# Patient Record
Sex: Male | Born: 1950 | Race: White | Hispanic: No | Marital: Married | Smoking: Never smoker
Health system: Southern US, Community
[De-identification: ages and names within clinical notes are randomized; demographics above are authoritative.]

## PROBLEM LIST (undated history)

## (undated) DIAGNOSIS — I10 Essential (primary) hypertension: Secondary | ICD-10-CM

## (undated) DIAGNOSIS — K219 Gastro-esophageal reflux disease without esophagitis: Secondary | ICD-10-CM

## (undated) DIAGNOSIS — K760 Fatty (change of) liver, not elsewhere classified: Secondary | ICD-10-CM

## (undated) DIAGNOSIS — E119 Type 2 diabetes mellitus without complications: Secondary | ICD-10-CM

## (undated) DIAGNOSIS — E785 Hyperlipidemia, unspecified: Secondary | ICD-10-CM

## (undated) HISTORY — PX: LYMPHADENECTOMY: SHX15

## (undated) HISTORY — DX: Essential (primary) hypertension: I10

## (undated) HISTORY — DX: Type 2 diabetes mellitus without complications: E11.9

## (undated) HISTORY — PX: UPPER GASTROINTESTINAL ENDOSCOPY: SHX188

## (undated) HISTORY — DX: Gastro-esophageal reflux disease without esophagitis: K21.9

## (undated) HISTORY — DX: Hyperlipidemia, unspecified: E78.5

## (undated) HISTORY — DX: Fatty (change of) liver, not elsewhere classified: K76.0

---

## 2008-05-24 ENCOUNTER — Encounter: Admission: RE | Admit: 2008-05-24 | Discharge: 2008-05-24 | Payer: Self-pay | Admitting: Family Medicine

## 2012-01-30 ENCOUNTER — Ambulatory Visit: Payer: Self-pay

## 2012-01-30 ENCOUNTER — Other Ambulatory Visit: Payer: Self-pay | Admitting: Occupational Medicine

## 2012-01-30 DIAGNOSIS — M25519 Pain in unspecified shoulder: Secondary | ICD-10-CM

## 2014-04-20 DIAGNOSIS — I10 Essential (primary) hypertension: Secondary | ICD-10-CM

## 2014-04-20 DIAGNOSIS — E7800 Pure hypercholesterolemia, unspecified: Secondary | ICD-10-CM

## 2014-04-20 DIAGNOSIS — K219 Gastro-esophageal reflux disease without esophagitis: Secondary | ICD-10-CM

## 2014-04-20 DIAGNOSIS — I739 Peripheral vascular disease, unspecified: Secondary | ICD-10-CM | POA: Insufficient documentation

## 2014-04-20 HISTORY — DX: Peripheral vascular disease, unspecified: I73.9

## 2014-04-20 HISTORY — DX: Essential (primary) hypertension: I10

## 2014-04-20 HISTORY — DX: Gastro-esophageal reflux disease without esophagitis: K21.9

## 2014-04-20 HISTORY — DX: Pure hypercholesterolemia, unspecified: E78.00

## 2015-01-29 HISTORY — PX: HERNIA REPAIR: SHX51

## 2015-10-10 DIAGNOSIS — M19049 Primary osteoarthritis, unspecified hand: Secondary | ICD-10-CM

## 2015-10-10 HISTORY — DX: Primary osteoarthritis, unspecified hand: M19.049

## 2016-03-13 DIAGNOSIS — E785 Hyperlipidemia, unspecified: Secondary | ICD-10-CM

## 2016-03-13 HISTORY — DX: Hyperlipidemia, unspecified: E78.5

## 2017-01-07 ENCOUNTER — Other Ambulatory Visit: Payer: Self-pay | Admitting: Orthopedic Surgery

## 2017-01-07 DIAGNOSIS — M5030 Other cervical disc degeneration, unspecified cervical region: Secondary | ICD-10-CM

## 2017-01-17 ENCOUNTER — Ambulatory Visit
Admission: RE | Admit: 2017-01-17 | Discharge: 2017-01-17 | Disposition: A | Discharge status: Home / Self Care | Payer: 59 | Source: Ambulatory Visit | Attending: Orthopedic Surgery | Admitting: Orthopedic Surgery

## 2017-01-17 ENCOUNTER — Other Ambulatory Visit: Payer: Self-pay

## 2017-01-17 DIAGNOSIS — M5030 Other cervical disc degeneration, unspecified cervical region: Secondary | ICD-10-CM

## 2017-07-04 DIAGNOSIS — K429 Umbilical hernia without obstruction or gangrene: Secondary | ICD-10-CM

## 2017-07-04 DIAGNOSIS — R011 Cardiac murmur, unspecified: Secondary | ICD-10-CM

## 2017-07-04 HISTORY — DX: Umbilical hernia without obstruction or gangrene: K42.9

## 2017-07-04 HISTORY — DX: Cardiac murmur, unspecified: R01.1

## 2017-11-25 ENCOUNTER — Ambulatory Visit (INDEPENDENT_AMBULATORY_CARE_PROVIDER_SITE_OTHER): Payer: 59 | Admitting: Cardiology

## 2017-11-25 ENCOUNTER — Encounter: Payer: Self-pay | Admitting: Cardiology

## 2017-11-25 VITALS — BP 124/70 | HR 90 | Ht 69.0 in | Wt 210.4 lb

## 2017-11-25 DIAGNOSIS — R9431 Abnormal electrocardiogram [ECG] [EKG]: Secondary | ICD-10-CM

## 2017-11-25 DIAGNOSIS — R0789 Other chest pain: Secondary | ICD-10-CM

## 2017-11-25 DIAGNOSIS — R7309 Other abnormal glucose: Secondary | ICD-10-CM | POA: Insufficient documentation

## 2017-11-25 HISTORY — DX: Other abnormal glucose: R73.09

## 2017-11-25 HISTORY — DX: Abnormal electrocardiogram (ECG) (EKG): R94.31

## 2017-11-25 HISTORY — DX: Other chest pain: R07.89

## 2017-11-25 NOTE — Progress Notes (Signed)
Cardiology Office Note:    Date:  11/25/2017   ID:  Jason Thornton, DOB 11/16/1950, MRN [id number]  PCP:  Manfred Shirts, PA  Cardiologist:  Jenne Campus, MD    Referring MD: Manfred Shirts, [place]   Chief Complaint  Patient presents with  . Follow-up  Doing well did have some cold-like symptoms 3 weeks ago  History of Present Illness:    Jason Thornton is a 67 y.o. male that I follow-up for many years he did not showed up in the office for about 1-1/2 years now about 3 weeks ago he started experiencing shortness of breath eventually was diagnosed with bronchitis that was treated with antibiotic he is doing better however want to be seen.  Described to have some exertional shortness of breath but overall seems to be doing well EKG today surprisingly showed possibility of anterior septal wall old MI.  Denies having any typical chest pain tightness squeezing pressure burning chest  Past Medical History:  Diagnosis Date  . Diabetes mellitus without complication ([place])   . Hypertension       Current Medications: Current Meds  Medication Sig  . aspirin EC 81 MG tablet Take 1 tablet by mouth daily.  Marland Kitchen lisinopril-hydrochlorothiazide (PRINZIDE,ZESTORETIC) 20-12.5 MG tablet Take 1 tablet by mouth daily.  . Omega-3 Fatty Acids (FISH OIL) 1000 MG CAPS Take 1 capsule by mouth daily.  . simvastatin (ZOCOR) 40 MG tablet Take 40 mg by mouth daily.     Allergies:   Niacin   Social History   Socioeconomic History  . Marital status: Married    Spouse name: Not on file  . Number of children: Not on file  . Years of education: Not on file  . Highest education level: Not on file  Occupational History  . Not on file  Social Needs  . Financial resource strain: Not on file  . Food insecurity:    Worry: Not on file    Inability: Not on file  . Transportation needs:    Medical: Not on file    Non-medical: Not on file  Tobacco Use  . Smoking status: Never Smoker  . Smokeless tobacco:  Never Used  Substance and Sexual Activity  . Alcohol use: Not Currently  . Drug use: Never  . Sexual activity: Not on file  Lifestyle  . Physical activity:    Days per week: Not on file    Minutes per session: Not on file  . Stress: Not on file  Relationships  . Social connections:    Talks on phone: Not on file    Gets together: Not on file    Attends religious service: Not on file    Active member of club or organization: Not on file    Attends meetings of clubs or organizations: Not on file    Relationship status: Not on file  Other Topics Concern  . Not on file  Social History Narrative  . Not on file     Family History: The patient's family history includes Alzheimer's disease in his mother; Heart attack in his father. ROS:   Please see the history of present illness.    All 14 point review of systems negative except as described per history of present illness  EKGs/Labs/Other Studies Reviewed:      Recent Labs: No results found for requested labs within last 8760 hours.  Recent Lipid Panel No results found for: CHOL, TRIG, HDL, CHOLHDL, VLDL, LDLCALC, LDLDIRECT  Physical Exam:  VS:  BP 124/70   Pulse 90   Ht 5' 9"  (1.753 m)   Wt 210 lb 6.4 oz (95.4 kg)   SpO2 96%   BMI 31.07 kg/m     Wt Readings from Last 3 Encounters:  11/25/17 210 lb 6.4 oz (95.4 kg)     GEN:  Well nourished, well developed in no acute distress HEENT: Normal NECK: No JVD; No carotid bruits LYMPHATICS: No lymphadenopathy CARDIAC: RRR, no murmurs, no rubs, no gallops RESPIRATORY:  Clear to auscultation without rales, wheezing or rhonchi  ABDOMEN: Soft, non-tender, non-distended MUSCULOSKELETAL:  No edema; No deformity  SKIN: Warm and dry LOWER EXTREMITIES: no swelling NEUROLOGIC:  Alert and oriented x 3 PSYCHIATRIC:  Normal affect   ASSESSMENT:    1. Elevated hemoglobin A1c   2. Abnormal EKG   3. Atypical chest pain    PLAN:    In order of problems listed  above:  1. Elevated hemoglobin A1c.  We will check his hemoglobin A1c today. 2. Abnormal EKG echocardiogram will be done. 3. Atypical chest pain denies having any.   Overall he seems to be doing well echocardiogram will be done to verify the presence of potentially all until microinfarction.  Hemoglobin A1c will be checked.   Medication Adjustments/Labs and Tests Ordered: Current medicines are reviewed at length with the patient today.  Concerns regarding medicines are outlined above.  Orders Placed This Encounter  Procedures  . Hemoglobin A1c  . ECHOCARDIOGRAM COMPLETE   Medication changes: No orders of the defined types were placed in this encounter.   Signed, Park Liter, MD, [facility] 11/25/2017 5:03 PM    [place]

## 2017-11-25 NOTE — Patient Instructions (Signed)
Medication Instructions:  Your physician recommends that you continue on your current medications as directed. Please refer to the Current Medication list given to you today.  If you need a refill on your cardiac medications before your next appointment, please call your pharmacy.   Lab work: Your physician recommends that you return for lab work today: Hemoglobin A1C   If you have labs (blood work) drawn today and your tests are completely normal, you will receive your results only by: Marland Kitchen MyChart Message (if you have MyChart) OR . A paper copy in the mail If you have any lab test that is abnormal or we need to change your treatment, we will call you to review the results.  Testing/Procedures: Your physician has requested that you have an echocardiogram. Echocardiography is a painless test that uses sound waves to create images of your heart. It provides your doctor with information about the size and shape of your heart and how well your heart's chambers and valves are working. This procedure takes approximately one hour. There are no restrictions for this procedure.    Follow-Up: At [facility], you and your health needs are our priority.  As part of our continuing mission to provide you with exceptional heart care, we have created designated Provider Care Teams.  These Care Teams include your primary Cardiologist (physician) and Advanced Practice Providers (APPs -  Physician Assistants and Nurse Practitioners) who all work together to provide you with the care you need, when you need it. You will need a follow up appointment in 5 months.  Please call our office 2 months in advance to schedule this appointment.  You may see No primary care provider on file. or another member of our [employer] Provider Team in [place]: Shirlee More, MD . Jyl Heinz, MD  Any Other Special Instructions Will Be Listed Below (If Applicable).  Echocardiogram An echocardiogram, or echocardiography,  uses sound waves (ultrasound) to produce an image of your heart. The echocardiogram is simple, painless, obtained within a short period of time, and offers valuable information to your health care provider. The images from an echocardiogram can provide information such as:  Evidence of coronary artery disease (CAD).  Heart size.  Heart muscle function.  Heart valve function.  Aneurysm detection.  Evidence of a past heart attack.  Fluid buildup around the heart.  Heart muscle thickening.  Assess heart valve function.  Tell a health care provider about:  Any allergies you have.  All medicines you are taking, including vitamins, herbs, eye drops, creams, and over-the-counter medicines.  Any problems you or family members have had with anesthetic medicines.  Any blood disorders you have.  Any surgeries you have had.  Any medical conditions you have.  Whether you are pregnant or may be pregnant. What happens before the procedure? No special preparation is needed. Eat and drink normally. What happens during the procedure?  In order to produce an image of your heart, gel will be applied to your chest and a wand-like tool (transducer) will be moved over your chest. The gel will help transmit the sound waves from the transducer. The sound waves will harmlessly bounce off your heart to allow the heart images to be captured in real-time motion. These images will then be recorded.  You may need an IV to receive a medicine that improves the quality of the pictures. What happens after the procedure? You may return to your normal schedule including diet, activities, and medicines, unless your health care  provider tells you otherwise. This information is not intended to replace advice given to you by your health care provider. Make sure you discuss any questions you have with your health care provider. Document Released: 01/12/2000 Document Revised: 09/02/2015 Document Reviewed:  09/21/2012 Elsevier Interactive Patient Education  2017 [employer].

## 2017-11-26 LAB — HEMOGLOBIN A1C
ESTIMATED AVERAGE GLUCOSE: 166 mg/dL
Hgb A1c MFr Bld: 7.4 % — ABNORMAL HIGH (ref 4.8–5.6)

## 2018-01-08 ENCOUNTER — Ambulatory Visit (INDEPENDENT_AMBULATORY_CARE_PROVIDER_SITE_OTHER): Payer: 59

## 2018-01-08 DIAGNOSIS — R9431 Abnormal electrocardiogram [ECG] [EKG]: Secondary | ICD-10-CM

## 2018-01-08 NOTE — Progress Notes (Signed)
Complete echocardiogram has been performed.  Jimmy Kipton Skillen RDCS, RVT 

## 2018-02-02 ENCOUNTER — Telehealth: Payer: Self-pay | Admitting: Cardiology

## 2018-02-02 NOTE — Telephone Encounter (Signed)
Wants a copy of labs for [employer] on letterhead for him to pick up

## 2018-02-02 NOTE — Telephone Encounter (Signed)
Called patient about this. He reports he already picked up from front desk He doesn't need anything else at this time.

## 2018-11-11 ENCOUNTER — Telehealth: Payer: Self-pay | Admitting: Gastroenterology

## 2018-11-11 DIAGNOSIS — R197 Diarrhea, unspecified: Secondary | ICD-10-CM

## 2018-11-11 DIAGNOSIS — R1032 Left lower quadrant pain: Secondary | ICD-10-CM

## 2018-11-11 DIAGNOSIS — K50919 Crohn's disease, unspecified, with unspecified complications: Secondary | ICD-10-CM

## 2018-11-11 DIAGNOSIS — D509 Iron deficiency anemia, unspecified: Secondary | ICD-10-CM

## 2018-11-11 NOTE — Telephone Encounter (Signed)
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  Pt's wife reported that pt is experiencing severe abd p and IBS symptoms: constipation.  Pt was offered appt for 12/08/18 but  requested a sooner appt and there is an opening today at 10:20 AM.  Please advise.

## 2018-11-12 ENCOUNTER — Encounter: Payer: Self-pay | Admitting: Gastroenterology

## 2018-11-12 NOTE — Telephone Encounter (Signed)
Patient returned call to the office-spoke with patient-patient informed of result note and MD recommendations; patient is agreeable with plan of care and verified PCP; result note routed to PCP per MD recommendations; Patient verbalized understanding of information/instructions;  Patient was advised to call the office at [phone] if questions/concerns arise;  Lab work orders have been placed in [place]; patient aware of labs needing to be completed at [facility] office prior to CT scan;  order for CT has been placed in Epic; RN to notify [place] CT for scheduling;

## 2018-11-12 NOTE — Telephone Encounter (Signed)
Left message for patient to call back to the office;  

## 2018-11-12 NOTE — Telephone Encounter (Signed)
Called and spoke with patient's wife-DPR verified-Brenda-Brenda reports the patient has been complaining of abd pain (LLQ) that he is trying to use a heating pad for minimal relief, using stool softeners (helping minimally), eating a high fiber diet; [name] reports the patient was dealing with bouts of constipation and the stomach pains were briefly relieved when the patient has a bowel movement;  Patient has been scheduled for an OV on 11/19/2018 at 3:40 pm-Brenda is aware of appt and will inform the patient of this appt;  Please advise of any measures that can be taken in the meantime prior to OV.

## 2018-11-12 NOTE — Telephone Encounter (Signed)
Not so sure what is going on with the patient He is not a complainer  Plan: -Lets get CBC, CMP, lipase and TSH -Proceed with CT Abdo/pelvis with p.o. and IV contrast -Follow-up as already scheduled with me Thx RG

## 2018-11-13 ENCOUNTER — Other Ambulatory Visit (INDEPENDENT_AMBULATORY_CARE_PROVIDER_SITE_OTHER): Payer: 59

## 2018-11-13 DIAGNOSIS — R1032 Left lower quadrant pain: Secondary | ICD-10-CM

## 2018-11-13 DIAGNOSIS — R197 Diarrhea, unspecified: Secondary | ICD-10-CM | POA: Diagnosis not present

## 2018-11-13 DIAGNOSIS — D509 Iron deficiency anemia, unspecified: Secondary | ICD-10-CM

## 2018-11-13 DIAGNOSIS — K50919 Crohn's disease, unspecified, with unspecified complications: Secondary | ICD-10-CM | POA: Diagnosis not present

## 2018-11-13 LAB — CBC WITH DIFFERENTIAL/PLATELET
Basophils Absolute: 0 10*3/uL (ref 0.0–0.1)
Basophils Relative: 0.5 % (ref 0.0–3.0)
Eosinophils Absolute: 0.1 10*3/uL (ref 0.0–0.7)
Eosinophils Relative: 1.4 % (ref 0.0–5.0)
HCT: 44.3 % (ref 39.0–52.0)
Hemoglobin: 15 g/dL (ref 13.0–17.0)
Lymphocytes Relative: 22.2 % (ref 12.0–46.0)
Lymphs Abs: 1.5 10*3/uL (ref 0.7–4.0)
MCHC: 33.8 g/dL (ref 30.0–36.0)
MCV: 88.9 fl (ref 78.0–100.0)
Monocytes Absolute: 0.7 10*3/uL (ref 0.1–1.0)
Monocytes Relative: 10.6 % (ref 3.0–12.0)
Neutro Abs: 4.4 10*3/uL (ref 1.4–7.7)
Neutrophils Relative %: 65.3 % (ref 43.0–77.0)
Platelets: 181 10*3/uL (ref 150.0–400.0)
RBC: 4.99 Mil/uL (ref 4.22–5.81)
RDW: 14.5 % (ref 11.5–15.5)
WBC: 6.8 10*3/uL (ref 4.0–10.5)

## 2018-11-13 LAB — COMPREHENSIVE METABOLIC PANEL
ALT: 21 U/L (ref 0–53)
AST: 16 U/L (ref 0–37)
Albumin: 4.4 g/dL (ref 3.5–5.2)
Alkaline Phosphatase: 59 U/L (ref 39–117)
BUN: 18 mg/dL (ref 6–23)
CO2: 26 mEq/L (ref 19–32)
Calcium: 9.5 mg/dL (ref 8.4–10.5)
Chloride: 100 mEq/L (ref 96–112)
Creatinine, Ser: 1.17 mg/dL (ref 0.40–1.50)
GFR: 61.95 mL/min (ref >60.00)
Glucose, Bld: 139 mg/dL — ABNORMAL HIGH (ref 70–99)
Potassium: 3.9 mEq/L (ref 3.5–5.1)
Sodium: 137 mEq/L (ref 135–145)
Total Bilirubin: 1.2 mg/dL (ref 0.2–1.2)
Total Protein: 7.1 g/dL (ref 6.0–8.3)

## 2018-11-13 LAB — TSH: TSH: 1.35 u[IU]/mL (ref 0.35–4.50)

## 2018-11-13 LAB — LIPASE: Lipase: 46 U/L (ref 11.0–59.0)

## 2018-11-13 NOTE — Telephone Encounter (Signed)
caleld and spoke with Pam at [facility]-she is going to call the patient to schedule CT abd/pel with the patient and advise on instructions/contrast pick up;

## 2018-11-19 ENCOUNTER — Encounter: Payer: Self-pay | Admitting: Gastroenterology

## 2018-11-19 ENCOUNTER — Encounter

## 2018-11-19 ENCOUNTER — Ambulatory Visit (INDEPENDENT_AMBULATORY_CARE_PROVIDER_SITE_OTHER)
Admission: RE | Admit: 2018-11-19 | Discharge: 2018-11-19 | Disposition: A | Discharge status: Home / Self Care | Payer: 59 | Source: Ambulatory Visit | Attending: Gastroenterology | Admitting: Gastroenterology

## 2018-11-19 ENCOUNTER — Ambulatory Visit (INDEPENDENT_AMBULATORY_CARE_PROVIDER_SITE_OTHER): Payer: 59 | Admitting: Gastroenterology

## 2018-11-19 ENCOUNTER — Other Ambulatory Visit: Payer: Self-pay

## 2018-11-19 VITALS — BP 126/78 | HR 80 | Temp 97.9°F | Ht 69.0 in | Wt 211.0 lb

## 2018-11-19 DIAGNOSIS — R1032 Left lower quadrant pain: Secondary | ICD-10-CM

## 2018-11-19 DIAGNOSIS — K50919 Crohn's disease, unspecified, with unspecified complications: Secondary | ICD-10-CM

## 2018-11-19 DIAGNOSIS — D509 Iron deficiency anemia, unspecified: Secondary | ICD-10-CM

## 2018-11-19 DIAGNOSIS — R197 Diarrhea, unspecified: Secondary | ICD-10-CM

## 2018-11-19 MED ORDER — IOHEXOL 300 MG/ML  SOLN
100.0000 mL | Freq: Once | INTRAMUSCULAR | Status: AC | PRN
  Administered 2018-11-19: 100 mL via INTRAVENOUS

## 2018-11-19 NOTE — Patient Instructions (Signed)
If you are age 68 or older, your body mass index should be between 23-30. Your Body mass index is 31.16 kg/m. If this is out of the aforementioned range listed, please consider follow up with your Primary Care Provider.  If you are age 37 or younger, your body mass index should be between 19-25. Your Body mass index is 31.16 kg/m. If this is out of the aformentioned range listed, please consider follow up with your Primary Care Provider.   You will be due for a recall colonoscopy in 07/2021. We will send you a reminder in the mail when it gets closer to that time.  Thank you,  Dr. Jackquline Denmark

## 2018-11-19 NOTE — Progress Notes (Signed)
Chief Complaint: Abdominal pain  Referring Provider:  Manfred Shirts, PA      ASSESSMENT AND PLAN;   #1.  LLQ pain (resolved). Neg CT 10/2018 except for colonic diverticulosis without diverticulitis  #2.  GERD.  Diet controlled.  Plan: - Continue current diet. - Copy of the CT given to the patient. - FU if with any problems - Recall colon at age 68. - Please correct the medical record-there is NO history of Crohn's disease.   HPI:    Jason Thornton is a 68 y.o. male  Started having left lower quadrant abdominal pain with abdominal bloating Underwent CT scan Abdo/pelvis-negative except for sigmoid diverticulosis without diverticulitis.  Currently patient doing very well.  He is having 2 bowel movements per day on high-fiber diet.  Denies having any further abdominal pain.  No nausea, vomiting, heartburn, regurgitation, odynophagia or dysphagia.  No significant diarrhea or constipation.  There is no melena or hematochezia. No unintentional weight loss.  Pleased with the progress.  SH-married lives with spouse Jason Thornton  Past GI procedures: -Colonoscopy January 2013-mild sigmoid diverticulosis, small internal hemorrhoids.  Repeat in 10 years. -EGD 10/2012-small hiatal hernia, negative small bowel biopsies for celiac. Past Medical History:  Diagnosis Date  . Diabetes mellitus without complication ([place])   . Fatty liver   . GERD (gastroesophageal reflux disease)   . Hyperlipidemia   . Hypertension     Past Surgical History:  Procedure Laterality Date  . COLONOSCOPY  02/25/2011   Mild sigmoid diverticulosis. Small internal hemorrhoids. Otherwise normal colonoscopy  . ESOPHAGOGASTRODUODENOSCOPY  11/18/2012   Presbyesophagus. Hiatal hernia  . LYMPHADENECTOMY  10/2006    Family History  Problem Relation Age of Onset  . Alzheimer's disease Mother   . Breast cancer Mother   . Heart attack Father   . Prostate cancer Father   . Prostate cancer Brother     Social  History   Tobacco Use  . Smoking status: Never Smoker  . Smokeless tobacco: Never Used  Substance Use Topics  . Alcohol use: Not Currently  . Drug use: Never    Current Outpatient Medications  Medication Sig Dispense Refill  . aspirin EC 81 MG tablet Take 1 tablet by mouth daily.    Marland Kitchen glimepiride (AMARYL) 2 MG tablet Take 2 mg by mouth 2 (two) times daily.    Marland Kitchen lisinopril-hydrochlorothiazide (PRINZIDE,ZESTORETIC) 20-12.5 MG tablet Take 1 tablet by mouth daily.  3  . Omega-3 Fatty Acids (FISH OIL) 1000 MG CAPS Take 1 capsule by mouth daily.    . simvastatin (ZOCOR) 40 MG tablet Take 40 mg by mouth daily.  3   No current facility-administered medications for this visit.     Allergies  Allergen Reactions  . Niacin Hives and Rash    Review of Systems:  Constitutional: Denies fever, chills, diaphoresis, appetite change and fatigue.  HEENT: Denies photophobia, eye pain, redness, hearing loss, ear pain, congestion, sore throat, rhinorrhea, sneezing, mouth sores, neck pain, neck stiffness and tinnitus.   Respiratory: Denies SOB, DOE, cough, chest tightness,  and wheezing.   Cardiovascular: Denies chest pain, palpitations and leg swelling.  Genitourinary: Denies dysuria, urgency, frequency, hematuria, flank pain and difficulty urinating.  Musculoskeletal: Denies myalgias, back pain, joint swelling, arthralgias and gait problem.  Skin: No rash.  Neurological: Denies dizziness, seizures, syncope, weakness, light-headedness, numbness and headaches.  Hematological: Denies adenopathy. Easy bruising, personal or family bleeding history  Psychiatric/Behavioral: No anxiety or depression     Physical Exam:  BP 126/78   Pulse 80   Temp 97.9 F (36.6 C)   Ht 5\' 9"  (1.753 m)   Wt 211 lb (95.7 kg)   SpO2 96%   BMI 31.16 kg/m  Filed Weights   11/19/18 1542  Weight: 211 lb (95.7 kg)   Constitutional:  Well-developed, in no acute distress. Psychiatric: Normal mood and affect.  Behavior is normal. HEENT: Pupils normal.  Conjunctivae are normal. No scleral icterus. Neck supple.  Cardiovascular: Normal rate, regular rhythm. No edema Pulmonary/chest: Effort normal and breath sounds normal. No wheezing, rales or rhonchi. Abdominal: Soft, nondistended. Nontender. Bowel sounds active throughout. There are no masses palpable. No hepatomegaly. Rectal:  defered Neurological: Alert and oriented to person place and time. Skin: Skin is warm and dry. No rashes noted.  Data Reviewed: I have personally reviewed following labs and imaging studies  CBC: CBC Latest Ref Rng & Units 11/13/2018  WBC 4.0 - 10.5 K/uL 6.8  Hemoglobin 13.0 - 17.0 g/dL 15.0  Hematocrit 39.0 - 52.0 % 44.3  Platelets 150.0 - 400.0 K/uL 181.0    CMP: CMP Latest Ref Rng & Units 11/13/2018  Glucose 70 - 99 mg/dL 139(H)  BUN 6 - 23 mg/dL 18  Creatinine 0.40 - 1.50 mg/dL 1.17  Sodium 135 - 145 mEq/L 137  Potassium 3.5 - 5.1 mEq/L 3.9  Chloride 96 - 112 mEq/L 100  CO2 19 - 32 mEq/L 26  Calcium 8.4 - 10.5 mg/dL 9.5  Total Protein 6.0 - 8.3 g/dL 7.1  Total Bilirubin 0.2 - 1.2 mg/dL 1.2  Alkaline Phos 39 - 117 U/L 59  AST 0 - 37 U/L 16  ALT 0 - 53 U/L 21    GFR: Estimated Creatinine Clearance: 69 mL/min (by C-G formula based on SCr of 1.17 mg/dL). Liver Function Tests: Recent Labs  Lab 11/13/18 0807  AST 16  ALT 21  ALKPHOS 59  BILITOT 1.2  PROT 7.1  ALBUMIN 4.4   Recent Labs  Lab 11/13/18 0807  LIPASE 46.0      Radiology Studies: Ct Abdomen Pelvis W Contrast  Result Date: 11/19/2018 CLINICAL DATA:  Patient with abdominal bloating. Pain. Prior umbilical hernia repair. History of Crohn's. EXAM: CT ABDOMEN AND PELVIS WITH CONTRAST TECHNIQUE: Multidetector CT imaging of the abdomen and pelvis was performed using the standard protocol following bolus administration of intravenous contrast. CONTRAST:  121mL OMNIPAQUE IOHEXOL 300 MG/ML  SOLN COMPARISON:  None. FINDINGS: Lower chest:  Normal heart size. Dependent atelectasis within the bilateral lower lobes. No pleural effusion. Hepatobiliary: The liver is normal in size and contour. No focal hepatic lesions identified. Gallbladder is unremarkable. No intrahepatic or extrahepatic biliary ductal dilatation. Pancreas: Unremarkable Spleen: Unremarkable Adrenals/Urinary Tract: Normal adrenal glands. Kidneys enhance symmetrically with contrast. Punctate 2 mm stone superior pole left kidney and inferior pole left kidney. Urinary bladder is unremarkable. Stomach/Bowel: Oral contrast material to the level of the rectum. Descending and sigmoid colonic diverticulosis. No CT evidence for acute diverticulitis. Normal appendix. Normal morphology of the stomach. No evidence for bowel obstruction. No free fluid or free intraperitoneal air. Vascular/Lymphatic: Normal caliber abdominal aorta. Peripheral calcified atherosclerotic plaque. No retroperitoneal lymphadenopathy. Reproductive: Heterogeneous prostate. Other: Small bilateral fat containing inguinal hernias. Musculoskeletal: Lumbar spine degenerative changes. No aggressive or acute appearing osseous lesions. IMPRESSION: No acute process within the abdomen or pelvis. Sigmoid colonic diverticulosis without CT evidence for acute diverticulitis. Electronically Signed   By: Lovey Newcomer M.D.   On: 11/19/2018 09:35      Merrie Roof  Lyndel Safe, MD 11/19/2018, 3:55 PM  Cc: Manfred Shirts, [place]

## 2018-11-29 ENCOUNTER — Emergency Department (HOSPITAL_BASED_OUTPATIENT_CLINIC_OR_DEPARTMENT_OTHER)
Admission: EM | Admit: 2018-11-29 | Discharge: 2018-11-29 | Disposition: A | Discharge status: Home / Self Care | Payer: 59 | Attending: Emergency Medicine | Admitting: Emergency Medicine

## 2018-11-29 ENCOUNTER — Other Ambulatory Visit: Payer: Self-pay

## 2018-11-29 ENCOUNTER — Emergency Department (HOSPITAL_BASED_OUTPATIENT_CLINIC_OR_DEPARTMENT_OTHER): Payer: 59

## 2018-11-29 ENCOUNTER — Encounter (HOSPITAL_BASED_OUTPATIENT_CLINIC_OR_DEPARTMENT_OTHER): Payer: Self-pay | Admitting: *Deleted

## 2018-11-29 DIAGNOSIS — Z79899 Other long term (current) drug therapy: Secondary | ICD-10-CM | POA: Insufficient documentation

## 2018-11-29 DIAGNOSIS — M25532 Pain in left wrist: Secondary | ICD-10-CM | POA: Insufficient documentation

## 2018-11-29 DIAGNOSIS — E119 Type 2 diabetes mellitus without complications: Secondary | ICD-10-CM | POA: Diagnosis not present

## 2018-11-29 DIAGNOSIS — I10 Essential (primary) hypertension: Secondary | ICD-10-CM | POA: Insufficient documentation

## 2018-11-29 DIAGNOSIS — M109 Gout, unspecified: Secondary | ICD-10-CM

## 2018-11-29 DIAGNOSIS — Z7984 Long term (current) use of oral hypoglycemic drugs: Secondary | ICD-10-CM | POA: Diagnosis not present

## 2018-11-29 DIAGNOSIS — Z7982 Long term (current) use of aspirin: Secondary | ICD-10-CM | POA: Insufficient documentation

## 2018-11-29 MED ORDER — INDOMETHACIN 25 MG PO CAPS: 25.0000 mg | ORAL_CAPSULE | Freq: Three times a day (TID) | ORAL | 0 refills | Status: DC

## 2018-11-29 MED ORDER — INDOMETHACIN 25 MG PO CAPS
50.0000 mg | ORAL_CAPSULE | ORAL | Status: AC
  Administered 2018-11-29: 07:00:00 50 mg via ORAL
  Filled 2018-11-29: qty 2

## 2018-11-29 MED ORDER — PREDNISONE 20 MG PO TABS: ORAL_TABLET | ORAL | 0 refills | Status: DC

## 2018-11-29 MED ORDER — PREDNISONE 50 MG PO TABS
60.0000 mg | ORAL_TABLET | Freq: Once | ORAL | Status: AC
  Administered 2018-11-29: 60 mg via ORAL
  Filled 2018-11-29: qty 1

## 2018-11-29 MED ORDER — ACETAMINOPHEN 500 MG PO TABS
1000.0000 mg | ORAL_TABLET | Freq: Once | ORAL | Status: AC
  Administered 2018-11-29: 07:00:00 1000 mg via ORAL
  Filled 2018-11-29: qty 2

## 2018-11-29 NOTE — ED Provider Notes (Signed)
[facility] Provider Note   CSN: [id number] Arrival date & time: 11/29/18  [id number]     History   Chief Complaint Chief Complaint  Patient presents with  . left wrist pain    HPI Jason Thornton is a 68 y.o. male.     The history is provided by the patient.  Wrist Pain This is a new problem. The current episode started 1 to 2 hours ago. The problem occurs constantly. The problem has not changed since onset.Pertinent negatives include no chest pain, no abdominal pain, no headaches and no shortness of breath. Nothing aggravates the symptoms. Nothing relieves the symptoms. He has tried nothing for the symptoms. The treatment provided no relief.  Left wrist pain that woke him from sleep.  Patient denies trauma.  No wounds. No weakness or numbness. Patient does endorse a h/o gout.    Past Medical History:  Diagnosis Date  . Diabetes mellitus without complication ([place])   . Fatty liver   . GERD (gastroesophageal reflux disease)   . Hyperlipidemia   . Hypertension     Patient Active Problem List   Diagnosis Date Noted  . Abnormal EKG 11/25/2017  . Elevated hemoglobin A1c 11/25/2017  . Atypical chest pain 11/25/2017    Past Surgical History:  Procedure Laterality Date  . COLONOSCOPY  02/25/2011   Mild sigmoid diverticulosis. Small internal hemorrhoids. Otherwise normal colonoscopy  . ESOPHAGOGASTRODUODENOSCOPY  11/18/2012   Presbyesophagus. Hiatal hernia  . LYMPHADENECTOMY  10/2006        Home Medications    Prior to Admission medications   Medication Sig Start Date End Date Taking? Authorizing Provider  aspirin EC 81 MG tablet Take 1 tablet by mouth daily.    [provider]  glimepiride (AMARYL) 2 MG tablet Take 2 mg by mouth 2 (two) times daily. 09/12/18   [provider]  lisinopril-hydrochlorothiazide (PRINZIDE,ZESTORETIC) 20-12.5 MG tablet Take 1 tablet by mouth daily. 10/06/17   [provider]  Omega-3 Fatty  Acids (FISH OIL) 1000 MG CAPS Take 1 capsule by mouth daily.    [provider]  simvastatin (ZOCOR) 40 MG tablet Take 40 mg by mouth daily. 10/31/17   [provider]    Family History Family History  Problem Relation Age of Onset  . Alzheimer's disease Mother   . Breast cancer Mother   . Heart attack Father   . Prostate cancer Father   . Prostate cancer Brother     Social History Social History   Tobacco Use  . Smoking status: Never Smoker  . Smokeless tobacco: Never Used  Substance Use Topics  . Alcohol use: Not Currently  . Drug use: Never     Allergies   Niacin   Review of Systems Review of Systems  Constitutional: Negative for fever.  HENT: Negative for congestion.   Eyes: Negative for visual disturbance.  Respiratory: Negative for shortness of breath.   Cardiovascular: Negative for chest pain.  Gastrointestinal: Negative for abdominal pain.  Musculoskeletal: Positive for arthralgias.  Skin: Negative for wound.  Neurological: Negative for weakness, numbness and headaches.  Psychiatric/Behavioral: Negative for agitation.  All other systems reviewed and are negative.    Physical Exam Updated Vital Signs BP (!) 149/85 (BP Location: Right Arm)   Pulse 90   Temp 98.1 F (36.7 C)   Resp 20   Ht 5\' 9"  (1.753 m)   Wt 94.3 kg   SpO2 100%   BMI 30.72 kg/m   Physical Exam  Vitals signs and nursing note reviewed.  Constitutional:      Appearance: Normal appearance.  HENT:     Head: Normocephalic and atraumatic.     Nose: Nose normal.  Eyes:     Conjunctiva/sclera: Conjunctivae normal.     Pupils: Pupils are equal, round, and reactive to light.  Neck:     Musculoskeletal: Normal range of motion and neck supple.  Cardiovascular:     Rate and Rhythm: Normal rate and regular rhythm.     Pulses: Normal pulses.     Heart sounds: Normal heart sounds.  Pulmonary:     Effort: Pulmonary effort is normal.     Breath sounds: Normal breath  sounds.  Abdominal:     General: Abdomen is flat. Bowel sounds are normal.     Tenderness: There is no abdominal tenderness. There is no guarding.  Musculoskeletal: Normal range of motion.     Left elbow: Normal.     Left wrist: He exhibits normal range of motion, no bony tenderness, no effusion, no crepitus, no deformity and no laceration.     Left forearm: Normal.     Left hand: Normal. He exhibits normal capillary refill. Normal sensation noted. Normal strength noted.  Neurological:     Mental Status: He is alert.      ED Treatments / Results  Labs (all labs ordered are listed, but only abnormal results are displayed) Labs Reviewed - No data to display  EKG None  Radiology No results found.  Procedures Procedures (including critical care time)  Medications Ordered in ED Medications  indomethacin (INDOCIN) capsule 50 mg (has no administration in time range)  acetaminophen (TYLENOL) tablet 1,000 mg (has no administration in time range)     Initial Impression / Assessment and Plan / ED Course  Exam is consistent with gout.  Will start indomethacin and steroids.  Decrease purines in your diet.  Drink copious water.     Jason Thornton was evaluated in Emergency Department on 11/29/2018 for the symptoms described in the history of present illness. He was evaluated in the context of the global COVID-19 pandemic, which necessitated consideration that the patient might be at risk for infection with the SARS-CoV-2 virus that causes COVID-19. Institutional protocols and algorithms that pertain to the evaluation of patients at risk for COVID-19 are in a state of rapid change based on information released by regulatory bodies including the CDC and federal and state organizations. These policies and algorithms were followed during the patient's care in the ED.   Final Clinical Impressions(s) / ED Diagnoses   Return for weakness, numbness, changes in vision or speech, fevers >100.4  unrelieved by medication, shortness of breath, intractable vomiting, or diarrhea, abdominal pain, Inability to tolerate liquids or food, cough, altered mental status or any concerns. No signs of systemic illness or infection. The patient is nontoxic-appearing on exam and vital signs are within normal limits.   I have reviewed the triage vital signs and the nursing notes. Pertinent labs &imaging results that were available during my care of the patient were reviewed by me and considered in my medical decision making (see chart for details).  After history, exam, and medical workup I feel the patient has been appropriately medically screened and is safe for discharge home. Pertinent diagnoses were discussed with the patient. Patient was given return precautions   Montgomery Rothlisberger, MD 11/29/18 [phone]

## 2018-11-29 NOTE — ED Triage Notes (Signed)
C/o left wrist pain that started Sat morning. Denies any injury. Pt has a history of gout. Describes as a constant pain. Has taken tylenol for pain with little relief.

## 2018-12-08 ENCOUNTER — Ambulatory Visit: Payer: 59 | Admitting: Gastroenterology

## 2019-02-01 DIAGNOSIS — E1151 Type 2 diabetes mellitus with diabetic peripheral angiopathy without gangrene: Secondary | ICD-10-CM

## 2019-02-01 HISTORY — DX: Type 2 diabetes mellitus with diabetic peripheral angiopathy without gangrene: E11.51

## 2019-06-23 DIAGNOSIS — M79674 Pain in right toe(s): Secondary | ICD-10-CM

## 2019-06-23 HISTORY — DX: Pain in right toe(s): M79.674

## 2019-12-03 DIAGNOSIS — E119 Type 2 diabetes mellitus without complications: Secondary | ICD-10-CM | POA: Insufficient documentation

## 2019-12-03 DIAGNOSIS — K760 Fatty (change of) liver, not elsewhere classified: Secondary | ICD-10-CM | POA: Insufficient documentation

## 2019-12-03 DIAGNOSIS — E785 Hyperlipidemia, unspecified: Secondary | ICD-10-CM | POA: Insufficient documentation

## 2019-12-05 IMAGING — DX DG WRIST COMPLETE 3+V*L*
4 series · 4 of 4 positions shown · non-contrast
Comparison: None.

CLINICAL DATA: Left wrist pain since yesterday morning. No known
injury.

EXAM:
LEFT WRIST - COMPLETE 3+ VIEW

[wrist ap]
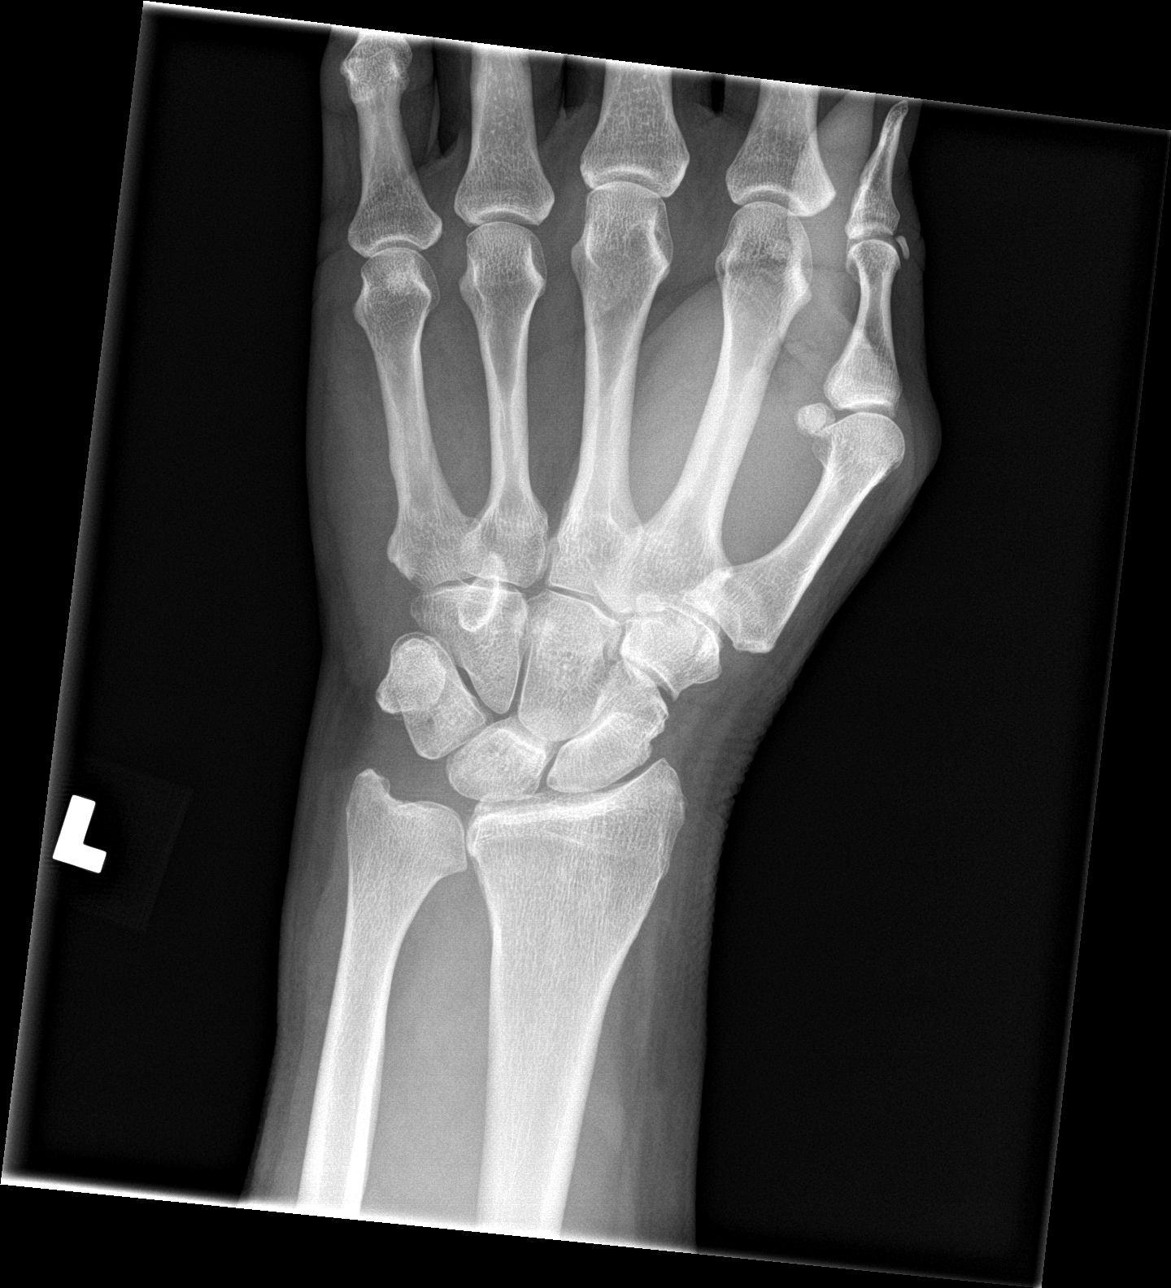

[wrist obl (1 of 2)]
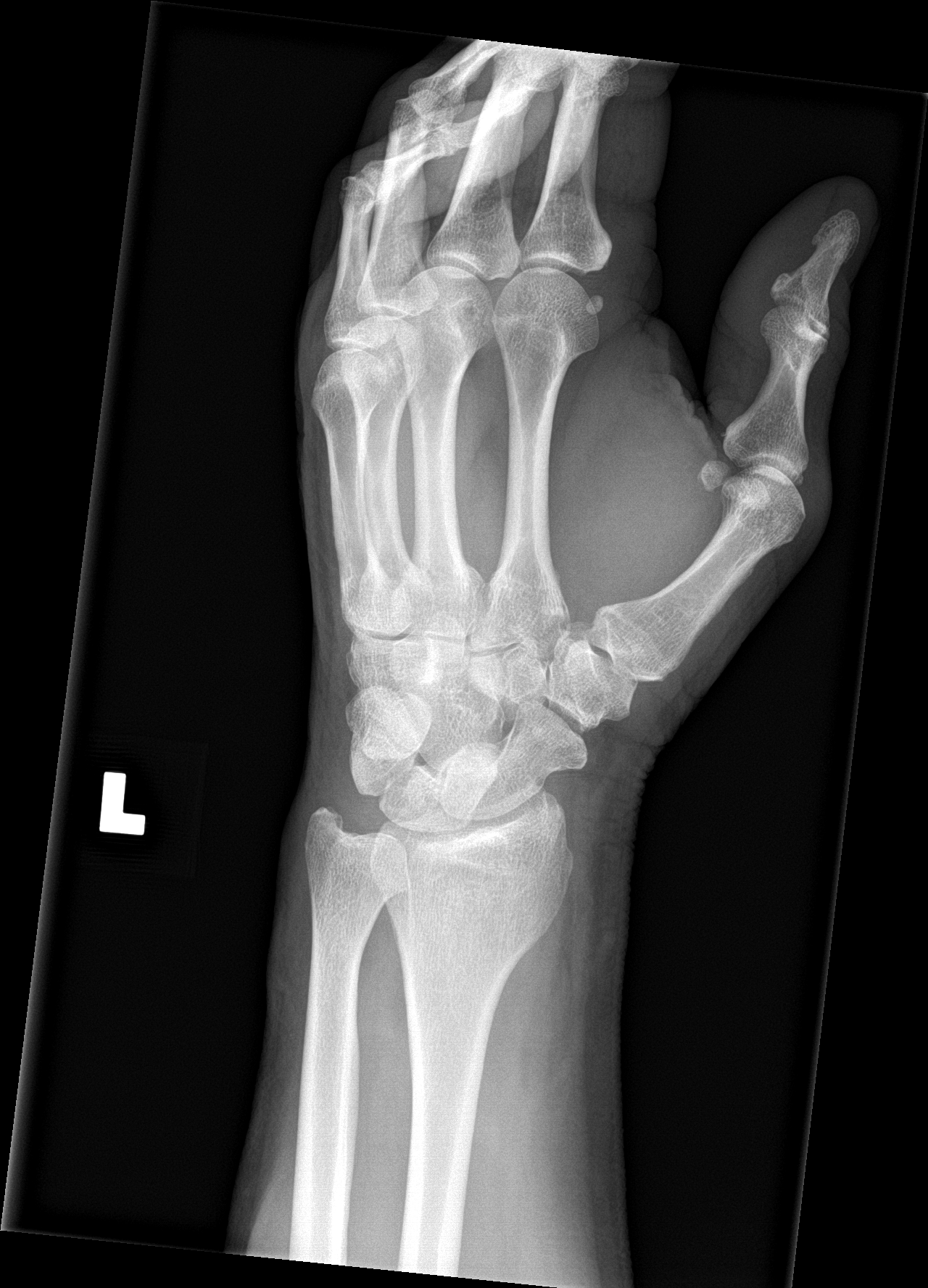

[wrist tunnel]
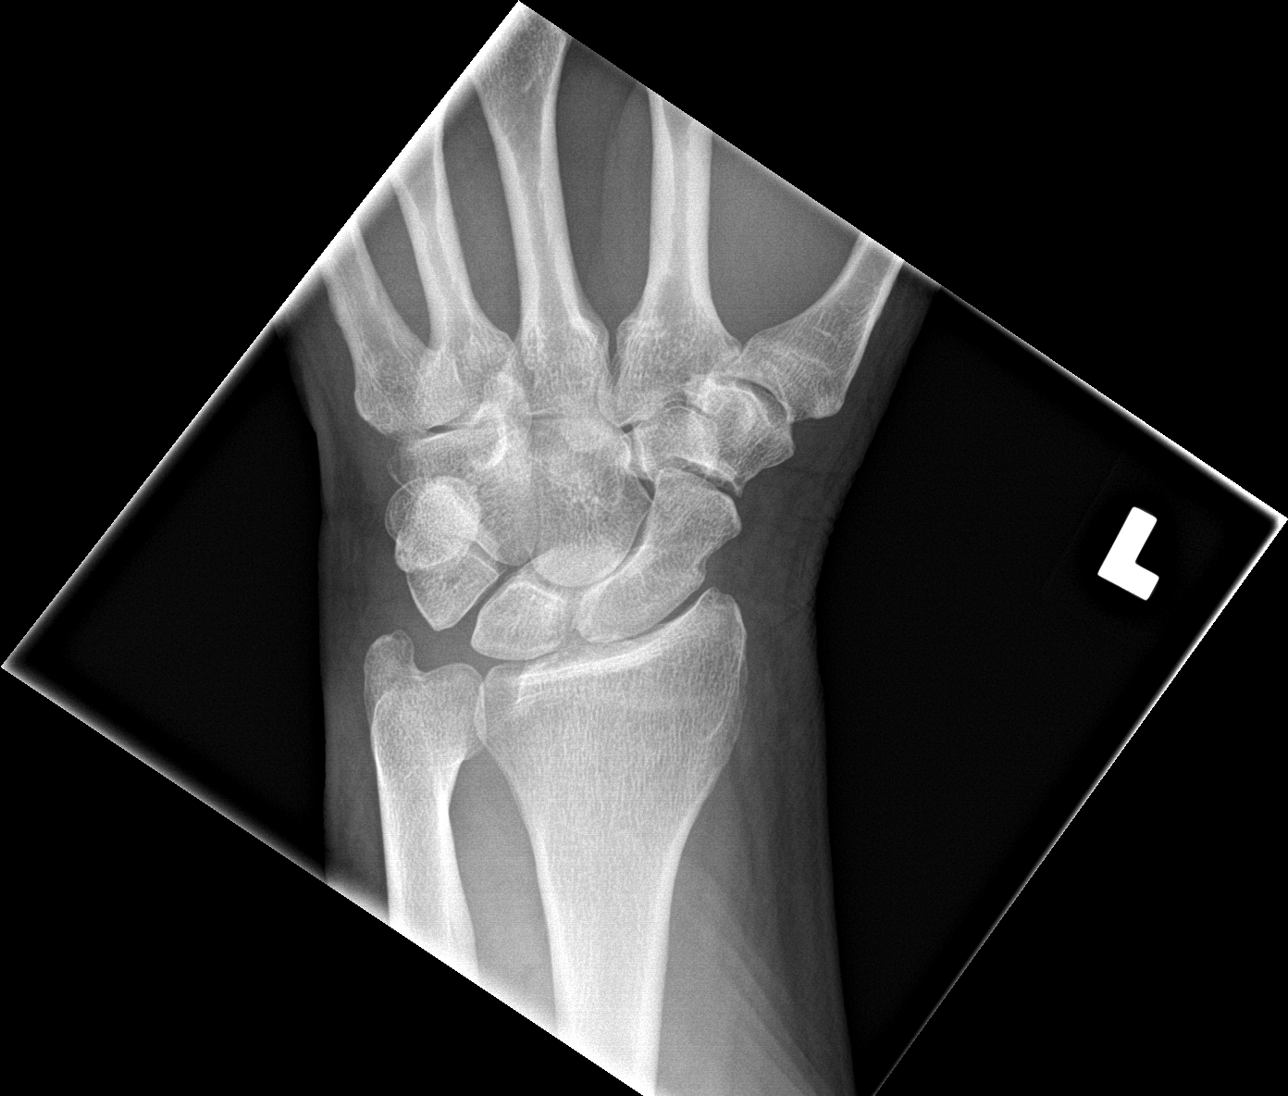

[wrist obl (2 of 2)]
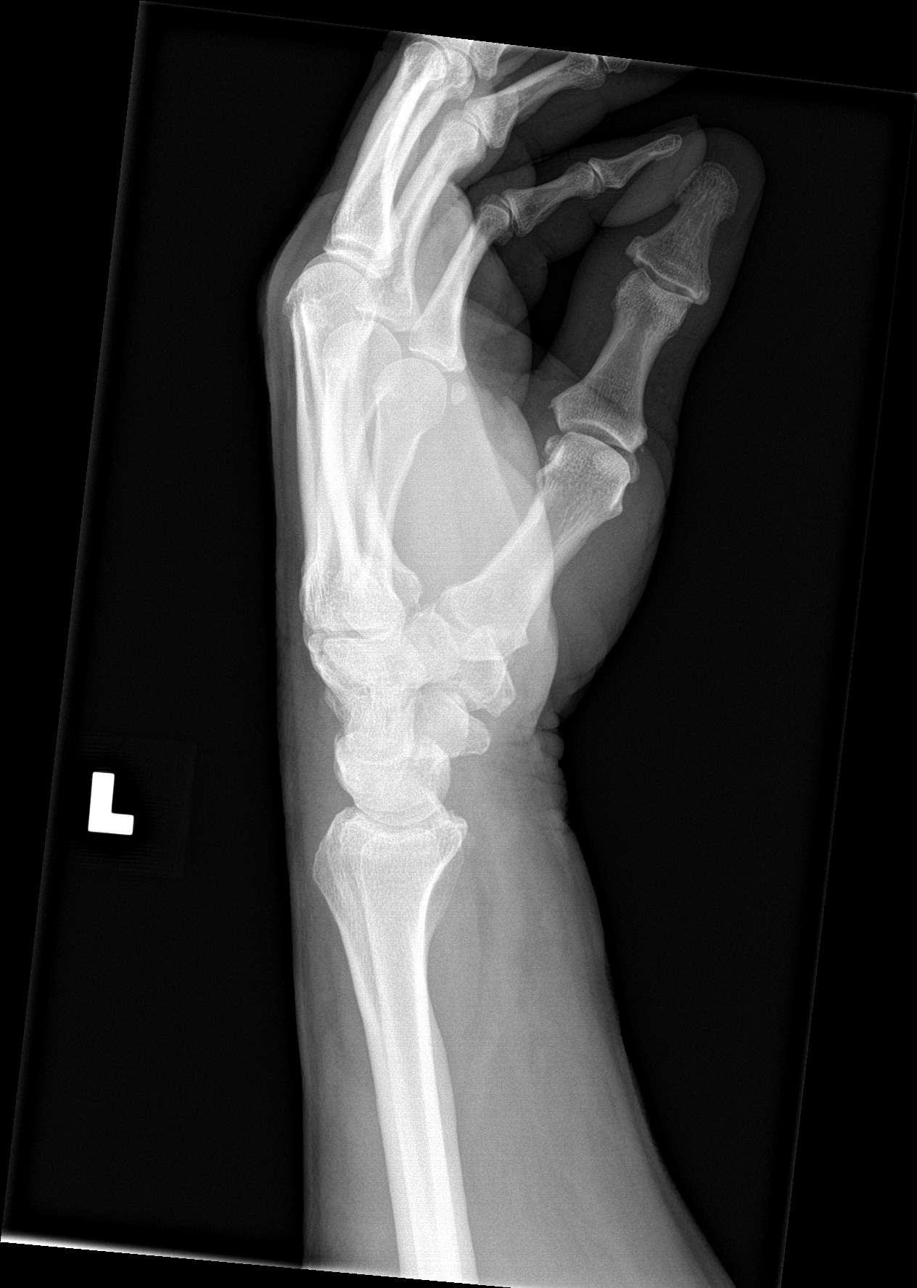

[4 of 4 positions shown; findings below may reference images not displayed]

FINDINGS: There is no evidence of fracture or dislocation. There is no
evidence of arthropathy or other focal bone abnormality. Soft
tissues are unremarkable.
IMPRESSION: Normal examination.

## 2019-12-06 ENCOUNTER — Other Ambulatory Visit: Payer: Self-pay

## 2019-12-06 ENCOUNTER — Ambulatory Visit (INDEPENDENT_AMBULATORY_CARE_PROVIDER_SITE_OTHER): Payer: 59 | Admitting: Cardiology

## 2019-12-06 ENCOUNTER — Encounter: Payer: Self-pay | Admitting: Cardiology

## 2019-12-06 VITALS — BP 124/94 | HR 74 | Ht 69.0 in | Wt 213.0 lb

## 2019-12-06 DIAGNOSIS — I739 Peripheral vascular disease, unspecified: Secondary | ICD-10-CM | POA: Diagnosis not present

## 2019-12-06 DIAGNOSIS — E1151 Type 2 diabetes mellitus with diabetic peripheral angiopathy without gangrene: Secondary | ICD-10-CM | POA: Diagnosis not present

## 2019-12-06 DIAGNOSIS — E119 Type 2 diabetes mellitus without complications: Secondary | ICD-10-CM

## 2019-12-06 DIAGNOSIS — E785 Hyperlipidemia, unspecified: Secondary | ICD-10-CM

## 2019-12-06 DIAGNOSIS — I10 Essential (primary) hypertension: Secondary | ICD-10-CM | POA: Diagnosis not present

## 2019-12-06 DIAGNOSIS — R0609 Other forms of dyspnea: Secondary | ICD-10-CM | POA: Insufficient documentation

## 2019-12-06 DIAGNOSIS — R0600 Dyspnea, unspecified: Secondary | ICD-10-CM

## 2019-12-06 DIAGNOSIS — R0789 Other chest pain: Secondary | ICD-10-CM

## 2019-12-06 HISTORY — DX: Other forms of dyspnea: R06.09

## 2019-12-06 HISTORY — DX: Dyspnea, unspecified: R06.00

## 2019-12-06 NOTE — Patient Instructions (Signed)
Medication Instructions:  Your physician recommends that you continue on your current medications as directed. Please refer to the Current Medication list given to you today.  *If you need a refill on your cardiac medications before your next appointment, please call your pharmacy*   Lab Work: Your physician recommends that you return for lab work today: lipid, cmp, tsh  If you have labs (blood work) drawn today and your tests are completely normal, you will receive your results only by: Marland Kitchen MyChart Message (if you have MyChart) OR . A paper copy in the mail If you have any lab test that is abnormal or we need to change your treatment, we will call you to review the results.   Testing/Procedures: Your physician has requested that you have an echocardiogram. Echocardiography is a painless test that uses sound waves to create images of your heart. It provides your doctor with information about the size and shape of your heart and how well your heart's chambers and valves are working. This procedure takes approximately one hour. There are no restrictions for this procedure.  Your physician has requested that you have a lower or upper extremity arterial duplex. This test is an ultrasound of the arteries in the legs or arms. It looks at arterial blood flow in the legs and arms. Allow one hour for Lower and Upper Arterial scans. There are no restrictions or special instructions    Follow-Up: At [facility], you and your health needs are our priority.  As part of our continuing mission to provide you with exceptional heart care, we have created designated Provider Care Teams.  These Care Teams include your primary Cardiologist (physician) and Advanced Practice Providers (APPs -  Physician Assistants and Nurse Practitioners) who all work together to provide you with the care you need, when you need it.  We recommend signing up for the patient portal called "MyChart".  Sign up information is provided  on this After Visit Summary.  MyChart is used to connect with patients for Virtual Visits (Telemedicine).  Patients are able to view lab/test results, encounter notes, upcoming appointments, etc.  Non-urgent messages can be sent to your provider as well.   To learn more about what you can do with MyChart, go to [email or web address].    Your next appointment:   3 month(s)  The format for your next appointment:   In Person  Provider:   Jenne Campus, MD   Other Instructions   Echocardiogram An echocardiogram is a procedure that uses painless sound waves (ultrasound) to produce an image of the heart. Images from an echocardiogram can provide important information about:  Signs of coronary artery disease (CAD).  Aneurysm detection. An aneurysm is a weak or damaged part of an artery wall that bulges out from the normal force of blood pumping through the body.  Heart size and shape. Changes in the size or shape of the heart can be associated with certain conditions, including heart failure, aneurysm, and CAD.  Heart muscle function.  Heart valve function.  Signs of a past heart attack.  Fluid buildup around the heart.  Thickening of the heart muscle.  A tumor or infectious growth around the heart valves. Tell a health care provider about:  Any allergies you have.  All medicines you are taking, including vitamins, herbs, eye drops, creams, and over-the-counter medicines.  Any blood disorders you have.  Any surgeries you have had.  Any medical conditions you have.  Whether you are pregnant or may be  pregnant. What are the risks? Generally, this is a safe procedure. However, problems may occur, including:  Allergic reaction to dye (contrast) that may be used during the procedure. What happens before the procedure? No specific preparation is needed. You may eat and drink normally. What happens during the procedure?   An IV tube may be inserted into one of your  veins.  You may receive contrast through this tube. A contrast is an injection that improves the quality of the pictures from your heart.  A gel will be applied to your chest.  A wand-like tool (transducer) will be moved over your chest. The gel will help to transmit the sound waves from the transducer.  The sound waves will harmlessly bounce off of your heart to allow the heart images to be captured in real-time motion. The images will be recorded on a computer. The procedure may vary among health care providers and hospitals. What happens after the procedure?  You may return to your normal, everyday life, including diet, activities, and medicines, unless your health care provider tells you not to do that. Summary  An echocardiogram is a procedure that uses painless sound waves (ultrasound) to produce an image of the heart.  Images from an echocardiogram can provide important information about the size and shape of your heart, heart muscle function, heart valve function, and fluid buildup around your heart.  You do not need to do anything to prepare before this procedure. You may eat and drink normally.  After the echocardiogram is completed, you may return to your normal, everyday life, unless your health care provider tells you not to do that. This information is not intended to replace advice given to you by your health care provider. Make sure you discuss any questions you have with your health care provider. Document Revised: 05/07/2018 Document Reviewed: 02/17/2016 Elsevier Patient Education  [place].

## 2019-12-06 NOTE — Addendum Note (Signed)
Addended by: Senaida Ores on: 12/06/2019 10:55 AM   Modules accepted: Orders

## 2019-12-06 NOTE — Progress Notes (Signed)
Cardiology Office Note:    Date:  12/06/2019   ID:  Jason Thornton, DOB 02-14-1950, MRN [id number]  PCP:  Manfred Shirts, PA  Cardiologist:  Jenne Campus, MD    Referring MD: Manfred Shirts, [place]   Chief Complaint  Patient presents with  . Annual Exam    History of Present Illness:    Jason Thornton is a 69 y.o. male past medical history significant for diabetes, essential hypertension, dyslipidemia.  Comes today to my office for follow-up I have not seen him since 2019.  He describes 2 issues #1 when he walks he will get fatigued tired and short of breath.  Also described to have some pain on the left side of his neck which is not related to exercise.  It was with turning his head.  Another complaint he have is the fact that he got pain in his leg sometimes when he walks but majority of time when he lay down at night.  He is diabetic with is not controlled, does have dyslipidemia.  Likely does not smoke.  He try to exercise on the regular basis but get tired very easily.  Denies have any chest pain tightness squeezing pressure burning chest or shortness of breath with exertion  Past Medical History:  Diagnosis Date  . Abnormal EKG 11/25/2017  . Arthritis pain, hand 10/10/2015  . Atypical chest pain 11/25/2017  . Diabetes mellitus without complication ([place])   . Elevated hemoglobin A1c 11/25/2017  . Essential hypertension 04/20/2014  . Fatty liver   . Gastroesophageal reflux disease without esophagitis 04/20/2014  . GERD (gastroesophageal reflux disease)   . Hyperlipidemia   . Hypertension   . Peripheral vascular disease ([place]) 04/20/2014   Formatting of this note might be different from the original. Non-obstructing carotids. Non-obstructing carotids.  Formatting of this note might be different from the original. Non-obstructing carotids. Formatting of this note might be different from the original. Overview:  Non-obstructing carotids.  . Type 2 diabetes mellitus with diabetic  peripheral angiopathy without gangrene, without long-term current use of insulin ([place]) 02/01/2019   Last Assessment & Plan:  Formatting of this note might be different from the original.    Past Surgical History:  Procedure Laterality Date  . COLONOSCOPY  02/25/2011   Mild sigmoid diverticulosis. Small internal hemorrhoids. Otherwise normal colonoscopy  . ESOPHAGOGASTRODUODENOSCOPY  11/18/2012   Presbyesophagus. Hiatal hernia  . LYMPHADENECTOMY  10/2006    Current Medications: Current Meds  Medication Sig  . aspirin EC 81 MG tablet Take 1 tablet by mouth daily.  Marland Kitchen glimepiride (AMARYL) 2 MG tablet Take 2 mg by mouth 2 (two) times daily.  Marland Kitchen lisinopril-hydrochlorothiazide (PRINZIDE,ZESTORETIC) 20-12.5 MG tablet Take 1 tablet by mouth daily.  . Omega-3 Fatty Acids (FISH OIL) 1000 MG CAPS Take 1 capsule by mouth daily.  . simvastatin (ZOCOR) 40 MG tablet Take 40 mg by mouth daily.     Allergies:   Niacin   Social History   Socioeconomic History  . Marital status: Married    Spouse name: Not on file  . Number of children: Not on file  . Years of education: Not on file  . Highest education level: Not on file  Occupational History  . Not on file  Tobacco Use  . Smoking status: Never Smoker  . Smokeless tobacco: Never Used  Vaping Use  . Vaping Use: Never used  Substance and Sexual Activity  . Alcohol use: Not Currently  . Drug use: Never  . Sexual  activity: Not on file  Other Topics Concern  . Not on file  Social History Narrative  . Not on file   Social Determinants of Health   Financial Resource Strain:   . Difficulty of Paying Living Expenses: Not on file  Food Insecurity:   . Worried About Charity fundraiser in the Last Year: Not on file  . Ran Out of Food in the Last Year: Not on file  Transportation Needs:   . Lack of Transportation (Medical): Not on file  . Lack of Transportation (Non-Medical): Not on file  Physical Activity:   . Days of Exercise per Week:  Not on file  . Minutes of Exercise per Session: Not on file  Stress:   . Feeling of Stress : Not on file  Social Connections:   . Frequency of Communication with Friends and Family: Not on file  . Frequency of Social Gatherings with Friends and Family: Not on file  . Attends Religious Services: Not on file  . Active Member of Clubs or Organizations: Not on file  . Attends Archivist Meetings: Not on file  . Marital Status: Not on file     Family History: The patient's family history includes Alzheimer's disease in his mother; Breast cancer in his mother; Heart attack in his father; Prostate cancer in his brother and father. ROS:   Please see the history of present illness.    All 14 point review of systems negative except as described per history of present illness  EKGs/Labs/Other Studies Reviewed:      Recent Labs: No results found for requested labs within last 8760 hours.  Recent Lipid Panel No results found for: CHOL, TRIG, HDL, CHOLHDL, VLDL, LDLCALC, LDLDIRECT  Physical Exam:    VS:  BP (!) 124/94 (BP Location: Left Arm, Patient Position: Sitting, Cuff Size: Normal)   Pulse 74   Ht 5' 9"  (1.753 m)   Wt 213 lb (96.6 kg)   SpO2 97%   BMI 31.45 kg/m     Wt Readings from Last 3 Encounters:  12/06/19 213 lb (96.6 kg)  11/29/18 208 lb (94.3 kg)  11/19/18 211 lb (95.7 kg)     GEN:  Well nourished, well developed in no acute distress HEENT: Normal NECK: No JVD; No carotid bruits LYMPHATICS: No lymphadenopathy CARDIAC: RRR, no murmurs, no rubs, no gallops RESPIRATORY:  Clear to auscultation without rales, wheezing or rhonchi  ABDOMEN: Soft, non-tender, non-distended MUSCULOSKELETAL:  No edema; No deformity  SKIN: Warm and dry LOWER EXTREMITIES: no swelling NEUROLOGIC:  Alert and oriented x 3 PSYCHIATRIC:  Normal affect   ASSESSMENT:    1. Peripheral vascular disease ([place])   2. Essential hypertension   3. Type 2 diabetes mellitus with diabetic  peripheral angiopathy without gangrene, without long-term current use of insulin ([place])   4. Diabetes mellitus without complication ([place])   5. Atypical chest pain   6. Dyslipidemia (high LDL; low HDL)   7. Dyspnea on exertion    PLAN:    In order of problems listed above:  1. Peripheral vascular disease.  He describes atypical symptoms of his lower extremities but when physical examination he does have poor pulse in the left side.  I will ask him to have arterial duplex of both lower extremities. 2. Essential hypertension blood pressure slightly elevated today but this is first visit after long time.  His diastolic appears to be particularly elevated.  He does have blood pressure monitor at home I advised  him to check on the regular basis let me know. 3. Diabetes he is thinking only Amaryl, I will check his hemoglobin A1c.  He is follow-up with primary care physician in December. 4. Atypical chest pain denies having any. 5. Dyslipidemia: We will do his fasting lipid profile. 6. Dyspnea on exertion: Echocardiogram will be done to check left ventricle ejection fraction   Medication Adjustments/Labs and Tests Ordered: Current medicines are reviewed at length with the patient today.  Concerns regarding medicines are outlined above.  No orders of the defined types were placed in this encounter.  Medication changes: No orders of the defined types were placed in this encounter.   Signed, Park Liter, MD, [facility] 12/06/2019 10:48 AM    [place]

## 2019-12-07 ENCOUNTER — Telehealth: Payer: Self-pay | Admitting: Cardiology

## 2019-12-07 ENCOUNTER — Telehealth: Payer: Self-pay | Admitting: Emergency Medicine

## 2019-12-07 DIAGNOSIS — E785 Hyperlipidemia, unspecified: Secondary | ICD-10-CM

## 2019-12-07 LAB — COMPREHENSIVE METABOLIC PANEL
ALT: 33 IU/L (ref 0–44)
AST: 24 IU/L (ref 0–40)
Albumin/Globulin Ratio: 2.3 — ABNORMAL HIGH (ref 1.2–2.2)
Albumin: 4.9 g/dL — ABNORMAL HIGH (ref 3.8–4.8)
Alkaline Phosphatase: 69 IU/L (ref 44–121)
BUN/Creatinine Ratio: 13 (ref 10–24)
BUN: 14 mg/dL (ref 8–27)
Bilirubin Total: 0.9 mg/dL (ref 0.0–1.2)
CO2: 26 mmol/L (ref 20–29)
Calcium: 10.3 mg/dL — ABNORMAL HIGH (ref 8.6–10.2)
Chloride: 100 mmol/L (ref 96–106)
Creatinine, Ser: 1.09 mg/dL (ref 0.76–1.27)
GFR calc Af Amer: 80 mL/min/{1.73_m2} (ref >59)
GFR calc non Af Amer: 69 mL/min/{1.73_m2} (ref >59)
Globulin, Total: 2.1 g/dL (ref 1.5–4.5)
Glucose: 125 mg/dL — ABNORMAL HIGH (ref 65–99)
Potassium: 4.2 mmol/L (ref 3.5–5.2)
Sodium: 139 mmol/L (ref 134–144)
Total Protein: 7 g/dL (ref 6.0–8.5)

## 2019-12-07 LAB — HEMOGLOBIN A1C
Est. average glucose Bld gHb Est-mCnc: 160 mg/dL
Hgb A1c MFr Bld: 7.2 % — ABNORMAL HIGH (ref 4.8–5.6)

## 2019-12-07 LAB — TSH: TSH: 1.59 u[IU]/mL (ref 0.450–4.500)

## 2019-12-07 LAB — LIPID PANEL
Chol/HDL Ratio: 4.8 ratio (ref 0.0–5.0)
Cholesterol, Total: 182 mg/dL (ref 100–199)
HDL: 38 mg/dL — ABNORMAL LOW (ref >39)
LDL Chol Calc (NIH): 102 mg/dL — ABNORMAL HIGH (ref 0–99)
Triglycerides: 247 mg/dL — ABNORMAL HIGH (ref 0–149)
VLDL Cholesterol Cal: 42 mg/dL — ABNORMAL HIGH (ref 5–40)

## 2019-12-07 MED ORDER — SIMVASTATIN 80 MG PO TABS: 80.0000 mg | ORAL_TABLET | Freq: Every day | ORAL | 1 refills | Status: DC

## 2019-12-07 NOTE — Telephone Encounter (Signed)
Called patient wife per dpr. Informed her of results. Informed her to have patient increase simvastatin to 80 mg daily and have labs redrawn in 6 weeks. No further questions.

## 2019-12-07 NOTE — Telephone Encounter (Signed)
-----   Message from Park Liter, MD sent at 12/07/2019  9:14 AM EST ----- Laboratory test shows still some elevation of cholesterol, I recommend doubling the dose of simvastatin to 80 mg daily, fasting lipid profile will be done within the next 6 weeks.  His hemoglobin A1c is 7.2 which is better than 2 years ago still we would like to see it less than 7.

## 2019-12-07 NOTE — Telephone Encounter (Signed)
Called patient wife informed her of results per dpr.

## 2019-12-07 NOTE — Telephone Encounter (Signed)
Patient's wife is returning call to discuss lab results. 

## 2019-12-09 ENCOUNTER — Telehealth: Payer: Self-pay | Admitting: Cardiology

## 2019-12-09 NOTE — Telephone Encounter (Signed)
Patient came by office to pick up copy of most recent labs.

## 2019-12-30 ENCOUNTER — Telehealth: Payer: Self-pay | Admitting: Emergency Medicine

## 2019-12-30 ENCOUNTER — Ambulatory Visit (INDEPENDENT_AMBULATORY_CARE_PROVIDER_SITE_OTHER): Payer: 59

## 2019-12-30 ENCOUNTER — Other Ambulatory Visit: Payer: Self-pay

## 2019-12-30 DIAGNOSIS — R0609 Other forms of dyspnea: Secondary | ICD-10-CM

## 2019-12-30 DIAGNOSIS — E785 Hyperlipidemia, unspecified: Secondary | ICD-10-CM

## 2019-12-30 DIAGNOSIS — I10 Essential (primary) hypertension: Secondary | ICD-10-CM

## 2019-12-30 DIAGNOSIS — R0789 Other chest pain: Secondary | ICD-10-CM

## 2019-12-30 DIAGNOSIS — R0600 Dyspnea, unspecified: Secondary | ICD-10-CM

## 2019-12-30 DIAGNOSIS — E1151 Type 2 diabetes mellitus with diabetic peripheral angiopathy without gangrene: Secondary | ICD-10-CM

## 2019-12-30 DIAGNOSIS — E119 Type 2 diabetes mellitus without complications: Secondary | ICD-10-CM

## 2019-12-30 DIAGNOSIS — I7789 Other specified disorders of arteries and arterioles: Secondary | ICD-10-CM

## 2019-12-30 DIAGNOSIS — I739 Peripheral vascular disease, unspecified: Secondary | ICD-10-CM | POA: Diagnosis not present

## 2019-12-30 LAB — ECHOCARDIOGRAM COMPLETE
Area-P 1/2: 2.73 cm2
S' Lateral: 2.7 cm

## 2019-12-30 NOTE — Progress Notes (Signed)
Complete echocardiogram/ Lower ext. arterial duplex performed.  Jimmy Hayden Kihara RDCS, RVT

## 2019-12-30 NOTE — Telephone Encounter (Signed)
Placed ct order. After precert approved will contact patient to get scheduled.

## 2019-12-31 NOTE — Telephone Encounter (Signed)
Called and spoke to patient wife. Informed her he will be scheduled for ct and needs labs 3 days before she is aware [place] scheduler will call him. No further questions.

## 2020-01-04 LAB — BASIC METABOLIC PANEL
BUN/Creatinine Ratio: 11 (ref 10–24)
BUN: 11 mg/dL (ref 8–27)
CO2: 21 mmol/L (ref 20–29)
Calcium: 9.4 mg/dL (ref 8.6–10.2)
Chloride: 98 mmol/L (ref 96–106)
Creatinine, Ser: 1.03 mg/dL (ref 0.76–1.27)
GFR calc Af Amer: 85 mL/min/{1.73_m2} (ref >59)
GFR calc non Af Amer: 74 mL/min/{1.73_m2} (ref >59)
Glucose: 172 mg/dL — ABNORMAL HIGH (ref 65–99)
Potassium: 4 mmol/L (ref 3.5–5.2)
Sodium: 134 mmol/L (ref 134–144)

## 2020-01-06 ENCOUNTER — Other Ambulatory Visit (HOSPITAL_BASED_OUTPATIENT_CLINIC_OR_DEPARTMENT_OTHER): Payer: 59

## 2020-01-07 ENCOUNTER — Ambulatory Visit (HOSPITAL_BASED_OUTPATIENT_CLINIC_OR_DEPARTMENT_OTHER)
Admission: RE | Admit: 2020-01-07 | Discharge: 2020-01-07 | Disposition: A | Discharge status: Home / Self Care | Payer: 59 | Source: Ambulatory Visit | Attending: Cardiology | Admitting: Cardiology

## 2020-01-07 ENCOUNTER — Other Ambulatory Visit: Payer: Self-pay

## 2020-01-07 DIAGNOSIS — I7789 Other specified disorders of arteries and arterioles: Secondary | ICD-10-CM | POA: Insufficient documentation

## 2020-01-07 MED ORDER — IOHEXOL 350 MG/ML SOLN
100.0000 mL | Freq: Once | INTRAVENOUS | Status: AC | PRN
  Administered 2020-01-07: 100 mL via INTRAVENOUS

## 2020-01-31 ENCOUNTER — Encounter: Payer: Self-pay | Admitting: Gastroenterology

## 2020-01-31 ENCOUNTER — Ambulatory Visit (INDEPENDENT_AMBULATORY_CARE_PROVIDER_SITE_OTHER): Payer: 59 | Admitting: Gastroenterology

## 2020-01-31 VITALS — BP 140/82 | HR 82 | Ht 68.0 in | Wt 219.4 lb

## 2020-01-31 DIAGNOSIS — K5730 Diverticulosis of large intestine without perforation or abscess without bleeding: Secondary | ICD-10-CM

## 2020-01-31 DIAGNOSIS — K219 Gastro-esophageal reflux disease without esophagitis: Secondary | ICD-10-CM

## 2020-01-31 DIAGNOSIS — R194 Change in bowel habit: Secondary | ICD-10-CM

## 2020-01-31 MED ORDER — CLENPIQ 10-3.5-12 MG-GM -GM/160ML PO SOLN: 1.0000 | ORAL | 0 refills | Status: DC

## 2020-01-31 NOTE — Progress Notes (Signed)
Chief Complaint: Abdominal pain  Referring Provider:  Manfred Shirts, PA      ASSESSMENT AND PLAN;   #1. LLQ pain. Neg CT 10/2018 except for colonic div without diverticulitis  #2. Change in bowel habits and stool caliber.  #3. GERD.  Diet controlled.  Plan: - Proceed with colonoscopy.  Discussed risks & benefits. (Risks including rare perforation req laparotomy, bleeding after bx/polypectomy req blood transfusion, rarely missing neoplasms, risks of anesthesia/sedation). Benefits outweigh the risks. Patient agrees to proceed. All the questions were answered. Consent forms given for review.  - Continue current meds including Colace for now. - Discussed with [name]   HPI:    Jason Thornton is a 70 y.o. male  For follow-up visit Accompanied by his wife.  Has been having change in bowel habits since October.  Initially had constipation and took Colace which got better.  Has been having change in stool caliber with occasional intermittent diarrhea.  At times, he has to strain.  Occasional left lower quadrant abdominal pain.   Umbilical hernia repair October 2021 with mesh.  No melena or hematochezia.  No weight loss.  Very much concerned about colon cancer.  Is due for colonoscopy next year anyway.   SH-married lives with spouse [name]  Past GI procedures: -Colonoscopy January 2013-mild sigmoid diverticulosis, small internal hemorrhoids.  Repeat in 10 years. -EGD 10/2012-small hiatal hernia, negative small bowel biopsies for celiac. -CT AP 10/2018-sigmoid diverticulosis without diverticulitis.  No other acute abnormalities. Past Medical History:  Diagnosis Date  . Abnormal EKG 11/25/2017  . Arthritis pain, hand 10/10/2015  . Atypical chest pain 11/25/2017  . Diabetes mellitus without complication ([place])   . Elevated hemoglobin A1c 11/25/2017  . Essential hypertension 04/20/2014  . Fatty liver   . Gastroesophageal reflux disease without esophagitis 04/20/2014  .  GERD (gastroesophageal reflux disease)   . Hyperlipidemia   . Hypertension   . Peripheral vascular disease ([place]) 04/20/2014   Formatting of this note might be different from the original. Non-obstructing carotids. Non-obstructing carotids.  Formatting of this note might be different from the original. Non-obstructing carotids. Formatting of this note might be different from the original. Overview:  Non-obstructing carotids.  . Type 2 diabetes mellitus with diabetic peripheral angiopathy without gangrene, without long-term current use of insulin ([place]) 02/01/2019   Last Assessment & Plan:  Formatting of this note might be different from the original.    Past Surgical History:  Procedure Laterality Date  . COLONOSCOPY  02/25/2011   Mild sigmoid diverticulosis. Small internal hemorrhoids. Otherwise normal colonoscopy  . ESOPHAGOGASTRODUODENOSCOPY  11/18/2012   Presbyesophagus. Hiatal hernia  . LYMPHADENECTOMY  10/2006    Family History  Problem Relation Age of Onset  . Alzheimer's disease Mother   . Breast cancer Mother   . Heart attack Father   . Prostate cancer Father   . Prostate cancer Brother     Social History   Tobacco Use  . Smoking status: Never Smoker  . Smokeless tobacco: Never Used  Vaping Use  . Vaping Use: Never used  Substance Use Topics  . Alcohol use: Not Currently  . Drug use: Never    Current Outpatient Medications  Medication Sig Dispense Refill  . aspirin EC 81 MG tablet Take 1 tablet by mouth daily.    Mariane Baumgarten Sodium (COLACE PO) Take 1 tablet by mouth daily as needed.    Marland Kitchen glimepiride (AMARYL) 2 MG tablet Take 2 mg by mouth 2 (two) times daily.    Marland Kitchen  lisinopril-hydrochlorothiazide (PRINZIDE,ZESTORETIC) 20-12.5 MG tablet Take 1 tablet by mouth daily.  3  . Omega-3 Fatty Acids (FISH OIL PO) Take 1 tablet by mouth daily.    . simvastatin (ZOCOR) 80 MG tablet Take 1 tablet (80 mg total) by mouth daily. 90 tablet 1   No current facility-administered  medications for this visit.    Allergies  Allergen Reactions  . Niacin Hives and Rash    Review of Systems:  Constitutional: Denies fever, chills, diaphoresis, appetite change and fatigue.  HEENT: Denies photophobia, eye pain, redness, hearing loss, ear pain, congestion, sore throat, rhinorrhea, sneezing, mouth sores, neck pain, neck stiffness and tinnitus.   Respiratory: Denies SOB, DOE, cough, chest tightness,  and wheezing.   Cardiovascular: Denies chest pain, palpitations and leg swelling.  Genitourinary: Denies dysuria, urgency, frequency, hematuria, flank pain and difficulty urinating.  Musculoskeletal: Denies myalgias, back pain, joint swelling, arthralgias and gait problem.  Skin: No rash.  Neurological: Denies dizziness, seizures, syncope, weakness, light-headedness, numbness and headaches.  Hematological: Denies adenopathy. Easy bruising, personal or family bleeding history  Psychiatric/Behavioral: No anxiety or depression     Physical Exam:    BP 140/82   Pulse 82   Ht 5' 8"  (1.727 m)   Wt 219 lb 6 oz (99.5 kg)   BMI 33.36 kg/m  Filed Weights   01/31/20 1430  Weight: 219 lb 6 oz (99.5 kg)   Constitutional:  Well-developed, in no acute distress. Psychiatric: Normal mood and affect. Behavior is normal. HEENT: Pupils normal.  Conjunctivae are normal. No scleral icterus. Neck supple.  Cardiovascular: Normal rate, regular rhythm. No edema Pulmonary/chest: Effort normal and breath sounds normal. No wheezing, rales or rhonchi. Abdominal: Soft, nondistended. Nontender. Bowel sounds active throughout. There are no masses palpable. No hepatomegaly. Rectal: To be performed at the time of colonoscopy. Neurological: Alert and oriented to person place and time. Skin: Skin is warm and dry. No rashes noted.  Data Reviewed: I have personally reviewed following labs and imaging studies  CBC: CBC Latest Ref Rng & Units 11/13/2018  WBC 4.0 - 10.5 K/uL 6.8  Hemoglobin 13.0 -  17.0 g/dL 15.0  Hematocrit 39.0 - 52.0 % 44.3  Platelets 150.0 - 400.0 K/uL 181.0    CMP: CMP Latest Ref Rng & Units 01/03/2020 12/06/2019 11/13/2018  Glucose 65 - 99 mg/dL 172(H) 125(H) 139(H)  BUN 8 - 27 mg/dL 11 14 18   Creatinine 0.76 - 1.27 mg/dL 1.03 1.09 1.17  Sodium 134 - 144 mmol/L 134 139 137  Potassium 3.5 - 5.2 mmol/L 4.0 4.2 3.9  Chloride 96 - 106 mmol/L 98 100 100  CO2 20 - 29 mmol/L 21 26 26   Calcium 8.6 - 10.2 mg/dL 9.4 10.3(H) 9.5  Total Protein 6.0 - 8.5 g/dL - 7.0 7.1  Total Bilirubin 0.0 - 1.2 mg/dL - 0.9 1.2  Alkaline Phos 44 - 121 IU/L - 69 59  AST 0 - 40 IU/L - 24 16  ALT 0 - 44 IU/L - 33 21    GFR: CrCl cannot be calculated (Patient's most recent lab result is older than the maximum 21 days allowed.). Liver Function Tests: No results for input(s): AST, ALT, ALKPHOS, BILITOT, PROT, ALBUMIN in the last 168 hours. No results for input(s): LIPASE, AMYLASE in the last 168 hours.    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 Multiplanar CT image reconstructions and MIPs were obtained to evaluate the vascular anatomy. CONTRAST:  140m OMNIPAQUE IOHEXOL 350 MG/ML SOLN COMPARISON:  None. FINDINGS: Cardiovascular: Conventional 3 vessel arch anatomy. The aortic root, ascending thoracic aorta, transverse aorta and descending thoracic aorta are all normal in caliber. The descending aorta is moderately tortuous. Scattered mild atherosclerotic calcifications are noted. The main pulmonary artery is normal in caliber. No evidence of central pulmonary embolus. The heart is normal in size. No pericardial effusion. Mediastinum/Nodes: Unremarkable CT appearance of the thyroid gland. No suspicious  mediastinal or hilar adenopathy. No soft tissue mediastinal mass. The thoracic esophagus is unremarkable. Lungs/Pleura: Lungs are clear. No pleural effusion or pneumothorax. Upper Abdomen: No acute abnormality. Musculoskeletal: No acute fracture or aggressive appearing lytic or blastic osseous lesion. Review of the MIP images confirms the above findings. IMPRESSION: 1. No evidence of aortic aneurysm, dissection or other abnormality. 2. Mild atherosclerotic vascular calcifications along the aorta. Aortic Atherosclerosis (ICD10-170.0) Signed, HCriselda Peaches MD, RPVI [facility] Center[facility] Electronically Signed   By: HJacqulynn CadetM.D.   On: 01/07/2020 11:54      RCarmell Austria MD 01/31/2020, 2:39 PM  Cc: BManfred Shirts PUtah

## 2020-01-31 NOTE — Patient Instructions (Signed)
If you are age 70 or older, your body mass index should be between 23-30. Your Body mass index is 33.36 kg/m. If this is out of the aforementioned range listed, please consider follow up with your Primary Care Provider.  If you are age 40 or younger, your body mass index should be between 19-25. Your Body mass index is 33.36 kg/m. If this is out of the aformentioned range listed, please consider follow up with your Primary Care Provider.   We have sent the following medications to your pharmacy for you to pick up at your convenience: Clenpiq  Due to recent changes in healthcare laws, you may see the results of your imaging and laboratory studies on MyChart before your provider has had a chance to review them.  We understand that in some cases there may be results that are confusing or concerning to you. Not all laboratory results come back in the same time frame and the provider may be waiting for multiple results in order to interpret others.  Please give [place] 48 hours in order for your provider to thoroughly review all the results before contacting the office for clarification of your results.   It was a pleasure to see you today!  Jackquline Denmark, M.D.

## 2020-02-25 ENCOUNTER — Encounter: Payer: Self-pay | Admitting: Gastroenterology

## 2020-02-28 DIAGNOSIS — I10 Essential (primary) hypertension: Secondary | ICD-10-CM | POA: Insufficient documentation

## 2020-02-28 DIAGNOSIS — K219 Gastro-esophageal reflux disease without esophagitis: Secondary | ICD-10-CM | POA: Insufficient documentation

## 2020-03-01 ENCOUNTER — Encounter: Payer: Self-pay | Admitting: Cardiology

## 2020-03-01 ENCOUNTER — Ambulatory Visit (INDEPENDENT_AMBULATORY_CARE_PROVIDER_SITE_OTHER): Payer: 59 | Admitting: Cardiology

## 2020-03-01 ENCOUNTER — Other Ambulatory Visit: Payer: Self-pay

## 2020-03-01 VITALS — BP 132/82 | HR 62 | Ht 68.0 in | Wt 216.0 lb

## 2020-03-01 DIAGNOSIS — E1151 Type 2 diabetes mellitus with diabetic peripheral angiopathy without gangrene: Secondary | ICD-10-CM | POA: Diagnosis not present

## 2020-03-01 DIAGNOSIS — R0789 Other chest pain: Secondary | ICD-10-CM

## 2020-03-01 DIAGNOSIS — I10 Essential (primary) hypertension: Secondary | ICD-10-CM | POA: Diagnosis not present

## 2020-03-01 DIAGNOSIS — E782 Mixed hyperlipidemia: Secondary | ICD-10-CM

## 2020-03-01 LAB — LIPID PANEL
Chol/HDL Ratio: 4.2 ratio (ref 0.0–5.0)
Cholesterol, Total: 147 mg/dL (ref 100–199)
HDL: 35 mg/dL — ABNORMAL LOW (ref >39)
LDL Chol Calc (NIH): 75 mg/dL (ref 0–99)
Triglycerides: 226 mg/dL — ABNORMAL HIGH (ref 0–149)
VLDL Cholesterol Cal: 37 mg/dL (ref 5–40)

## 2020-03-01 NOTE — Progress Notes (Signed)
Cardiology Office Note:    Date:  03/01/2020   ID:  Jason Thornton, DOB 11-Feb-1950, MRN [id number]  PCP:  Manfred Shirts, PA  Cardiologist:  Jenne Campus, MD    Referring MD: Manfred Shirts, [place]   Chief Complaint  Patient presents with  . Follow-up  I am doing fine  History of Present Illness:    Jason Thornton is a 70 y.o. male with past medical history significant for diabetes, essential hypertension, dyslipidemia, peripheral vascular disease.  I did see him few months ago he was complaining of being tired fatigued and weak.  We end up doing echocardiogram to assess left ventricle ejection fraction which showed preserved left ventricle ejection fraction, since that time he is doing better.  He said he is Much more energy and he can do more.  There was also some issue about complaining having pain in his lower extremities.  I did arterial duplex evaluation of lower extremities did not show any critical stenosis.  He does not smoke he try to be active he try to walk on the regular basis every other day however now because of snow he is slow down with that.  Overall he seems to be doing well.  Past Medical History:  Diagnosis Date  . Abnormal EKG 11/25/2017  . Arthritis pain, hand 10/10/2015  . Atypical chest pain 11/25/2017  . Claudication in peripheral vascular disease ([place]) 04/20/2014   Formatting of this note might be different from the original. Non-obstructing carotids. Non-obstructing carotids.  Formatting of this note might be different from the original. Non-obstructing carotids. Formatting of this note might be different from the original. Overview:  Non-obstructing carotids.  . Diabetes mellitus without complication ([place])   . Dyslipidemia (high LDL; low HDL) 03/13/2016  . Dyspnea on exertion 12/06/2019  . Elevated hemoglobin A1c 11/25/2017  . Essential hypertension 04/20/2014  . Fatty liver   . Gastroesophageal reflux disease without esophagitis 04/20/2014  . GERD  (gastroesophageal reflux disease)   . Hyperlipidemia   . Hypertension   . Murmur, cardiac 07/04/2017  . Pain of toe of right foot 06/23/2019  . Peripheral vascular disease ([place]) 04/20/2014   Formatting of this note might be different from the original. Non-obstructing carotids. Non-obstructing carotids.  Formatting of this note might be different from the original. Non-obstructing carotids. Formatting of this note might be different from the original. Overview:  Non-obstructing carotids.  . Pure hypercholesterolemia 04/20/2014  . Type 2 diabetes mellitus with diabetic peripheral angiopathy without gangrene, without long-term current use of insulin ([place]) 02/01/2019   Last Assessment & Plan:  Formatting of this note might be different from the original.  . Umbilical hernia 08/28/2749    Past Surgical History:  Procedure Laterality Date  . COLONOSCOPY  02/25/2011   Mild sigmoid diverticulosis. Small internal hemorrhoids. Otherwise normal colonoscopy  . ESOPHAGOGASTRODUODENOSCOPY  11/18/2012   Presbyesophagus. Hiatal hernia  . HERNIA REPAIR  2017  . LYMPHADENECTOMY  10/2006    Current Medications: Current Meds  Medication Sig  . aspirin EC 81 MG tablet Take 1 tablet by mouth daily.  Mariane Baumgarten Sodium (COLACE PO) Take 1 tablet by mouth daily as needed.  Marland Kitchen glimepiride (AMARYL) 2 MG tablet Take 2 mg by mouth 2 (two) times daily.  Marland Kitchen lisinopril-hydrochlorothiazide (PRINZIDE,ZESTORETIC) 20-12.5 MG tablet Take 1 tablet by mouth daily.  . Omega-3 Fatty Acids (FISH OIL PO) Take 1 tablet by mouth daily.  . simvastatin (ZOCOR) 80 MG tablet Take 1 tablet (80 mg total) by  mouth daily.  . Sod Picosulfate-Mag Ox-Cit Acd (CLENPIQ) 10-3.5-12 MG-GM -GM/160ML SOLN Take 1 kit by mouth as directed.     Allergies:   Niacin   Social History   Socioeconomic History  . Marital status: Married    Spouse name: Not on file  . Number of children: Not on file  . Years of education: Not on file  . Highest education  level: Not on file  Occupational History  . Not on file  Tobacco Use  . Smoking status: Never Smoker  . Smokeless tobacco: Never Used  Vaping Use  . Vaping Use: Never used  Substance and Sexual Activity  . Alcohol use: Not Currently  . Drug use: Never  . Sexual activity: Not on file  Other Topics Concern  . Not on file  Social History Narrative  . Not on file   Social Determinants of Health   Financial Resource Strain: Not on file  Food Insecurity: Not on file  Transportation Needs: Not on file  Physical Activity: Not on file  Stress: Not on file  Social Connections: Not on file     Family History: The patient's family history includes Alzheimer's disease in his mother; Breast cancer in his mother; Heart attack in his father; Prostate cancer in his brother and father. ROS:   Please see the history of present illness.    All 14 point review of systems negative except as described per history of present illness  EKGs/Labs/Other Studies Reviewed:      Recent Labs: 12/06/2019: ALT 33; TSH 1.590 01/03/2020: BUN 11; Creatinine, Ser 1.03; Potassium 4.0; Sodium 134  Recent Lipid Panel    Component Value Date/Time   CHOL 182 12/06/2019 1058   TRIG 247 (H) 12/06/2019 1058   HDL 38 (L) 12/06/2019 1058   CHOLHDL 4.8 12/06/2019 1058   LDLCALC 102 (H) 12/06/2019 1058    Physical Exam:    VS:  BP 132/82 (BP Location: Right Arm, Patient Position: Sitting)   Pulse 62   Ht 5' 8"  (1.727 m)   Wt 216 lb (98 kg)   SpO2 94%   BMI 32.84 kg/m     Wt Readings from Last 3 Encounters:  03/01/20 216 lb (98 kg)  01/31/20 219 lb 6 oz (99.5 kg)  12/06/19 213 lb (96.6 kg)     GEN:  Well nourished, well developed in no acute distress HEENT: Normal NECK: No JVD; No carotid bruits LYMPHATICS: No lymphadenopathy CARDIAC: RRR, no murmurs, no rubs, no gallops RESPIRATORY:  Clear to auscultation without rales, wheezing or rhonchi  ABDOMEN: Soft, non-tender,  non-distended MUSCULOSKELETAL:  No edema; No deformity  SKIN: Warm and dry LOWER EXTREMITIES: no swelling NEUROLOGIC:  Alert and oriented x 3 PSYCHIATRIC:  Normal affect   ASSESSMENT:    1. Essential hypertension   2. Type 2 diabetes mellitus with diabetic peripheral angiopathy without gangrene, without long-term current use of insulin ([place])   3. Mixed hyperlipidemia   4. Atypical chest pain    PLAN:    In order of problems listed above:  1. Essential hypertension blood pressure well controlled continue present management. 2. Type 2 diabetes I did review K PN which show me his hemoglobin A1c of 7.2 this is from December 06, 2019 I told him that he need to work better on his diabetes one of the way to accomplish that goal will be to exercise on the regular basis. 3. Dyslipidemia he is on moderate intensity statin in form of simvastatin 80 mg  daily which is the maximum dose of this medication I did review his K PN from December 06, 2019 his LDL was 102 and HDL 38.  I will repeat his fasting lipid profile if cholesterol still elevated like it was last time we will probably switch him to more effective statin high intensity statin like Crestor 40 or add Zetia.  I explained to him rational for that and he is willing to pursue it. 4. Overall we did talk about healthy lifestyle need to exercise and regular basis when she is trying to do.   Medication Adjustments/Labs and Tests Ordered: Current medicines are reviewed at length with the patient today.  Concerns regarding medicines are outlined above.  No orders of the defined types were placed in this encounter.  Medication changes: No orders of the defined types were placed in this encounter.   Signed, Park Liter, MD, [facility] 03/01/2020 8:46 AM    [place]

## 2020-03-01 NOTE — Patient Instructions (Signed)
Medication Instructions:  °Your physician recommends that you continue on your current medications as directed. Please refer to the Current Medication list given to you today. ° °*If you need a refill on your cardiac medications before your next appointment, please call your pharmacy* ° ° °Lab Work: °Your physician recommends that you return for lab work today: lipid  ° °If you have labs (blood work) drawn today and your tests are completely normal, you will receive your results only by: °MyChart Message (if you have MyChart) OR °A paper copy in the mail °If you have any lab test that is abnormal or we need to change your treatment, we will call you to review the results. ° ° °Testing/Procedures: °none ° ° °Follow-Up: °At [facility], you and your health needs are our priority.  As part of our continuing mission to provide you with exceptional heart care, we have created designated Provider Care Teams.  These Care Teams include your primary Cardiologist (physician) and Advanced Practice Providers (APPs -  Physician Assistants and Nurse Practitioners) who all work together to provide you with the care you need, when you need it. ° °We recommend signing up for the patient portal called "MyChart".  Sign up information is provided on this After Visit Summary.  MyChart is used to connect with patients for Virtual Visits (Telemedicine).  Patients are able to view lab/test results, encounter notes, upcoming appointments, etc.  Non-urgent messages can be sent to your provider as well.   °To learn more about what you can do with MyChart, go to [email or web address].   ° °Your next appointment:   °6 month(s) ° °The format for your next appointment:   °In Person ° °Provider:   °Dionicio Krasowski, MD  ° ° °Other Instructions ° ° ° °

## 2020-03-02 ENCOUNTER — Telehealth: Payer: Self-pay

## 2020-03-02 NOTE — Telephone Encounter (Signed)
-----   Message from Hieu J Krasowski, MD sent at 03/02/2020 12:56 PM EST ----- Please stop his simvastatin and start him on Crestor 20 mg daily 

## 2020-03-02 NOTE — Telephone Encounter (Signed)
Left a message to return my call.

## 2020-03-03 ENCOUNTER — Telehealth: Payer: Self-pay

## 2020-03-03 NOTE — Telephone Encounter (Signed)
I spoke with patient wife, informed the following per Dr. Agustin Cree. She will talk to the patient and call us back with the final decision. Declined starting new recommended prescription at this time.

## 2020-03-03 NOTE — Telephone Encounter (Signed)
-----   Message from Park Liter, MD sent at 03/02/2020 12:56 PM EST ----- Please stop his simvastatin and start him on Crestor 20 mg daily

## 2020-03-09 ENCOUNTER — Other Ambulatory Visit: Payer: Self-pay

## 2020-03-09 ENCOUNTER — Encounter: Payer: Self-pay | Admitting: Gastroenterology

## 2020-03-09 ENCOUNTER — Ambulatory Visit (AMBULATORY_SURGERY_CENTER): Payer: 59 | Admitting: Gastroenterology

## 2020-03-09 VITALS — BP 120/75 | HR 60 | Temp 97.7°F | Resp 29 | Ht 68.0 in | Wt 219.0 lb

## 2020-03-09 DIAGNOSIS — D124 Benign neoplasm of descending colon: Secondary | ICD-10-CM

## 2020-03-09 DIAGNOSIS — R194 Change in bowel habit: Secondary | ICD-10-CM | POA: Diagnosis not present

## 2020-03-09 DIAGNOSIS — D122 Benign neoplasm of ascending colon: Secondary | ICD-10-CM

## 2020-03-09 DIAGNOSIS — K5730 Diverticulosis of large intestine without perforation or abscess without bleeding: Secondary | ICD-10-CM | POA: Diagnosis present

## 2020-03-09 MED ORDER — SODIUM CHLORIDE 0.9 % IV SOLN: 500.0000 mL | Freq: Once | INTRAVENOUS | Status: AC

## 2020-03-09 NOTE — Progress Notes (Signed)
VS- Ernestine Conrad RN

## 2020-03-09 NOTE — Patient Instructions (Signed)
Information on polyps, diverticulosis and hemorrhoids given to you today.  Await pathology results.  Eat a high fiber diet and drink plenty of water.  Benefiber supplement  - take 1 tablespoon by mouth in 8 oz of water.  Avoid NSAIDS (Aspirin, Ibuprofen, Aleve, Naproxen) for 5 days after polyp removal. You may use Tylenol as needed.  YOU HAD AN ENDOSCOPIC PROCEDURE TODAY AT [facility]:   Refer to the procedure report that was given to you for any specific questions about what was found during the examination.  If the procedure report does not answer your questions, please call your gastroenterologist to clarify.  If you requested that your care partner not be given the details of your procedure findings, then the procedure report has been included in a sealed envelope for you to review at your convenience later.  YOU SHOULD EXPECT: Some feelings of bloating in the abdomen. Passage of more gas than usual.  Walking can help get rid of the air that was put into your GI tract during the procedure and reduce the bloating. If you had a lower endoscopy (such as a colonoscopy or flexible sigmoidoscopy) you may notice spotting of blood in your stool or on the toilet paper. If you underwent a bowel prep for your procedure, you may not have a normal bowel movement for a few days.  Please Note:  You might notice some irritation and congestion in your nose or some drainage.  This is from the oxygen used during your procedure.  There is no need for concern and it should clear up in a day or so.  SYMPTOMS TO REPORT IMMEDIATELY:   Following lower endoscopy (colonoscopy or flexible sigmoidoscopy):  Excessive amounts of blood in the stool  Significant tenderness or worsening of abdominal pains  Swelling of the abdomen that is new, acute  Fever of 100F or higher   For urgent or emergent issues, a gastroenterologist can be reached at any hour by calling [phone]. Do not use MyChart  messaging for urgent concerns.    DIET:  We do recommend a small meal at first, but then you may proceed to your regular diet.  Drink plenty of fluids but you should avoid alcoholic beverages for 24 hours.  ACTIVITY:  You should plan to take it easy for the rest of today and you should NOT DRIVE or use heavy machinery until tomorrow (because of the sedation medicines used during the test).    FOLLOW UP: Our staff will call the number listed on your records 48-72 hours following your procedure to check on you and address any questions or concerns that you may have regarding the information given to you following your procedure. If we do not reach you, we will leave a message.  We will attempt to reach you two times.  During this call, we will ask if you have developed any symptoms of COVID 19. If you develop any symptoms (ie: fever, flu-like symptoms, shortness of breath, cough etc.) before then, please call [phone].  If you test positive for Covid 19 in the 2 weeks post procedure, please call and report this information to [place].    If any biopsies were taken you will be contacted by phone or by letter within the next 1-3 weeks.  Please call us at [phone] if you have not heard about the biopsies in 3 weeks.    SIGNATURES/CONFIDENTIALITY: You and/or your care partner have signed paperwork which will be entered into your  that the information above on your After Visit Summary has been reviewed and is understood.  Full responsibility of the confidentiality of this discharge information lies with you and/or your care-partner.  

## 2020-03-09 NOTE — Op Note (Signed)
[facility] Patient Name: Jason Thornton Procedure Date: 03/09/2020 11:05 AM MRN: [id number] Endoscopist: Jackquline Denmark , MD Age: 70 Referring MD:  Date of Birth: [birth year] Gender: Male Account #: [id number] Procedure:                Colonoscopy Indications:              1. History of recent diverticulitis. 2. Change in                            bowel habits. 3. Colorectal cancer screening. Medicines:                Monitored Anesthesia Care Procedure:                Pre-Anesthesia Assessment:                           - Prior to the procedure, a History and Physical                            was performed, and patient medications and                            allergies were reviewed. The patient's tolerance of                            previous anesthesia was also reviewed. The risks                            and benefits of the procedure and the sedation                            options and risks were discussed with the patient.                            All questions were answered, and informed consent                            was obtained. Prior Anticoagulants: The patient has                            taken no previous anticoagulant or antiplatelet                            agents. ASA Grade Assessment: II - A patient with                            mild systemic disease. After reviewing the risks                            and benefits, the patient was deemed in                            satisfactory condition to undergo the procedure.  After obtaining informed consent, the colonoscope                            was passed under direct vision. Throughout the                            procedure, the patient's blood pressure, pulse, and                            oxygen saturations were monitored continuously. The                            Olympus CF-HQ190L ([id number]) Colonoscope was                            introduced through  the anus and advanced to the 2                            cm into the ileum. The colonoscopy was performed                            without difficulty. The patient tolerated the                            procedure well. The quality of the bowel                            preparation was good. The terminal ileum, ileocecal                            valve, appendiceal orifice, and rectum were                            photographed. Scope In: 11:11:42 AM Scope Out: 11:26:34 AM Scope Withdrawal Time: 0 hours 12 minutes 14 seconds  Total Procedure Duration: 0 hours 14 minutes 52 seconds  Findings:                 Four sessile polyps were found in the mid ascending                            colon and distal ascending colon. The polyps were 6                            to 8 mm in size. These polyps were removed with a                            cold snare. Resection and retrieval were complete.                           A 10 mm polyp was found in the proximal descending                            colon. The  polyp was sessile. The polyp was removed                            with a cold snare. Resection and retrieval were                            complete.                           Multiple medium-mouthed diverticula were found in                            the sigmoid colon and descending colon.                           Non-bleeding internal hemorrhoids were found during                            retroflexion. The hemorrhoids were small.                           The terminal ileum appeared normal.                           The exam was otherwise without abnormality on                            direct and retroflexion views. Complications:            No immediate complications. Estimated Blood Loss:     Estimated blood loss: none. Impression:               -Colonic polyps s/p polypectomy.                           -Left colonic diverticulosis predominantly in the                             sigmoid colon.                           -Small internal hemorrhoids                           -Otherwise normal colonoscopy to TI. Recommendation:           - Patient has a contact number available for                            emergencies. The signs and symptoms of potential                            delayed complications were discussed with the                            patient. Return to normal activities tomorrow.  Written discharge instructions were provided to the                            patient.                           - High fiber diet. If he gets constipated, he would                            start taking Benefiber 1 tablespoon p.o. daily with                            8 ounces of water.                           - Continue present medications.                           - Await pathology results.                           - No aspirin, ibuprofen, naproxen, or other                            non-steroidal anti-inflammatory drugs for 5 days                            after polyp removal.                           - The findings and recommendations were discussed                            with the patient's wife [name]. Jackquline Denmark, MD 03/09/2020 11:33:14 AM This report has been signed electronically.

## 2020-03-09 NOTE — Progress Notes (Signed)
Pt Drowsy. VSS. To PACU, report to RN. No anesthetic complications noted.  

## 2020-03-09 NOTE — Progress Notes (Signed)
Called to room to assist during endoscopic procedure.  Patient ID and intended procedure confirmed with present staff. Received instructions for my participation in the procedure from the performing physician.  

## 2020-03-13 ENCOUNTER — Telehealth: Payer: Self-pay

## 2020-03-13 NOTE — Telephone Encounter (Signed)
LVM

## 2020-03-19 ENCOUNTER — Encounter: Payer: Self-pay | Admitting: Gastroenterology

## 2020-03-24 ENCOUNTER — Telehealth: Payer: Self-pay | Admitting: Gastroenterology

## 2020-03-24 NOTE — Telephone Encounter (Signed)
Pt's spouse is requesting a call back regarding pt's pathology report.

## 2020-03-24 NOTE — Telephone Encounter (Signed)
Spoke to patient's wife [name] to inform her that a letter was mailed out regarding the biopsy report. She was informed of the results over the phone. All questions answered. She voiced understanding.

## 2020-06-03 ENCOUNTER — Other Ambulatory Visit: Payer: Self-pay | Admitting: Cardiology

## 2020-09-07 ENCOUNTER — Ambulatory Visit: Payer: 59 | Admitting: Cardiology

## 2020-09-07 DIAGNOSIS — H524 Presbyopia: Secondary | ICD-10-CM | POA: Diagnosis not present

## 2020-09-07 DIAGNOSIS — H02824 Cysts of left upper eyelid: Secondary | ICD-10-CM | POA: Diagnosis not present

## 2020-09-07 DIAGNOSIS — H2513 Age-related nuclear cataract, bilateral: Secondary | ICD-10-CM | POA: Diagnosis not present

## 2020-09-07 DIAGNOSIS — H35363 Drusen (degenerative) of macula, bilateral: Secondary | ICD-10-CM | POA: Diagnosis not present

## 2020-09-07 DIAGNOSIS — E119 Type 2 diabetes mellitus without complications: Secondary | ICD-10-CM | POA: Diagnosis not present

## 2020-09-07 DIAGNOSIS — H353130 Nonexudative age-related macular degeneration, bilateral, stage unspecified: Secondary | ICD-10-CM | POA: Diagnosis not present

## 2020-09-07 DIAGNOSIS — H35443 Age-related reticular degeneration of retina, bilateral: Secondary | ICD-10-CM | POA: Diagnosis not present

## 2020-09-07 DIAGNOSIS — Z7984 Long term (current) use of oral hypoglycemic drugs: Secondary | ICD-10-CM | POA: Diagnosis not present

## 2020-09-07 DIAGNOSIS — H31113 Age-related choroidal atrophy, bilateral: Secondary | ICD-10-CM | POA: Diagnosis not present

## 2020-10-25 ENCOUNTER — Ambulatory Visit: Payer: PPO | Admitting: Cardiology

## 2020-10-25 ENCOUNTER — Other Ambulatory Visit: Payer: Self-pay

## 2020-10-25 VITALS — BP 138/76 | HR 84 | Ht 68.0 in | Wt 213.6 lb

## 2020-10-25 DIAGNOSIS — I10 Essential (primary) hypertension: Secondary | ICD-10-CM

## 2020-10-25 DIAGNOSIS — E1151 Type 2 diabetes mellitus with diabetic peripheral angiopathy without gangrene: Secondary | ICD-10-CM | POA: Diagnosis not present

## 2020-10-25 DIAGNOSIS — R0600 Dyspnea, unspecified: Secondary | ICD-10-CM | POA: Diagnosis not present

## 2020-10-25 DIAGNOSIS — I739 Peripheral vascular disease, unspecified: Secondary | ICD-10-CM | POA: Diagnosis not present

## 2020-10-25 DIAGNOSIS — R0609 Other forms of dyspnea: Secondary | ICD-10-CM

## 2020-10-25 NOTE — Progress Notes (Signed)
Cardiology Office Note:    Date:  10/25/2020   ID:  Jason Thornton, DOB 19-Nov-1950, MRN [id number]  PCP:  Manfred Shirts, PA  Cardiologist:  Jenne Campus, MD    Referring MD: Manfred Shirts, [place]   No chief complaint on file. I am doing fine  History of Present Illness:    Jason Thornton is a 70 y.o. male with past medical history significant for diabetes, essential hypertension, dyslipidemia, peripheral vascular disease I did see him off complaint being fatigue tiredness echocardiogram done was good and after that he started feeling better then he was complaining of having some pain in his legs with a vascular study of lower extremities no significant obstruction since that time he seems to be doing well.  He denies have any chest pain tightness squeezing pressure burning chest.  He does have his family property that he is taking care of he is cutting the grass have no difficulty doing it.  Overall seems to be doing well  Past Medical History:  Diagnosis Date   Abnormal EKG 11/25/2017   Arthritis pain, hand 10/10/2015   Atypical chest pain 11/25/2017   Claudication in peripheral vascular disease ([place]) 04/20/2014   Formatting of this note might be different from the original. Non-obstructing carotids. Non-obstructing carotids.  Formatting of this note might be different from the original. Non-obstructing carotids. Formatting of this note might be different from the original. Overview:  Non-obstructing carotids.   Diabetes mellitus without complication ([place])    Dyslipidemia (high LDL; low HDL) 03/13/2016   Dyspnea on exertion 12/06/2019   Elevated hemoglobin A1c 11/25/2017   Essential hypertension 04/20/2014   Fatty liver    Gastroesophageal reflux disease without esophagitis 04/20/2014   GERD (gastroesophageal reflux disease)    Hyperlipidemia    Hypertension    Murmur, cardiac 07/04/2017   Pain of toe of right foot 06/23/2019   Peripheral vascular disease ([place]) 04/20/2014   Formatting  of this note might be different from the original. Non-obstructing carotids. Non-obstructing carotids.  Formatting of this note might be different from the original. Non-obstructing carotids. Formatting of this note might be different from the original. Overview:  Non-obstructing carotids.   Pure hypercholesterolemia 04/20/2014   Type 2 diabetes mellitus with diabetic peripheral angiopathy without gangrene, without long-term current use of insulin ([place]) 02/01/2019   Last Assessment & Plan:  Formatting of this note might be different from the original.   Umbilical hernia 03/01/9796    Past Surgical History:  Procedure Laterality Date   COLONOSCOPY  02/25/2011   Mild sigmoid diverticulosis. Small internal hemorrhoids. Otherwise normal colonoscopy   ESOPHAGOGASTRODUODENOSCOPY  11/18/2012   Presbyesophagus. Hiatal hernia   HERNIA REPAIR  2017   LYMPHADENECTOMY  10/2006   UPPER GASTROINTESTINAL ENDOSCOPY      Current Medications: No outpatient medications have been marked as taking for the 10/25/20 encounter (Office Visit) with Park Liter, MD.   Current Facility-Administered Medications for the 10/25/20 encounter (Office Visit) with Park Liter, MD  Medication   0.9 %  sodium chloride infusion     Allergies:   Niacin   Social History   Socioeconomic History   Marital status: Married    Spouse name: Not on file   Number of children: Not on file   Years of education: Not on file   Highest education level: Not on file  Occupational History   Not on file  Tobacco Use   Smoking status: Never   Smokeless tobacco: Never  Vaping Use   Vaping Use: Never used  Substance and Sexual Activity   Alcohol use: Not Currently   Drug use: Never   Sexual activity: Not on file  Other Topics Concern   Not on file  Social History Narrative   Not on file   Social Determinants of Health   Financial Resource Strain: Not on file  Food Insecurity: Not on file  Transportation Needs:  Not on file  Physical Activity: Not on file  Stress: Not on file  Social Connections: Not on file     Family History: The patient's family history includes Alzheimer's disease in his mother; Breast cancer in his mother; Heart attack in his father; Prostate cancer in his brother and father. There is no history of Colon cancer, Esophageal cancer, Rectal cancer, or Stomach cancer. ROS:   Please see the history of present illness.    All 14 point review of systems negative except as described per history of present illness  EKGs/Labs/Other Studies Reviewed:      Recent Labs: 12/06/2019: ALT 33; TSH 1.590 01/03/2020: BUN 11; Creatinine, Ser 1.03; Potassium 4.0; Sodium 134  Recent Lipid Panel    Component Value Date/Time   CHOL 147 03/01/2020 0854   TRIG 226 (H) 03/01/2020 0854   HDL 35 (L) 03/01/2020 0854   CHOLHDL 4.2 03/01/2020 0854   LDLCALC 75 03/01/2020 0854    Physical Exam:    VS:  BP 138/76 (BP Location: Left Arm, Patient Position: Sitting)   Pulse 84   Ht 5' 8"  (1.727 m)   Wt 213 lb 9.6 oz (96.9 kg)   SpO2 95%   BMI 32.48 kg/m     Wt Readings from Last 3 Encounters:  10/25/20 213 lb 9.6 oz (96.9 kg)  03/09/20 219 lb (99.3 kg)  03/01/20 216 lb (98 kg)     GEN:  Well nourished, well developed in no acute distress HEENT: Normal NECK: No JVD; No carotid bruits LYMPHATICS: No lymphadenopathy CARDIAC: RRR, no murmurs, no rubs, no gallops RESPIRATORY:  Clear to auscultation without rales, wheezing or rhonchi  ABDOMEN: Soft, non-tender, non-distended MUSCULOSKELETAL:  No edema; No deformity  SKIN: Warm and dry LOWER EXTREMITIES: no swelling NEUROLOGIC:  Alert and oriented x 3 PSYCHIATRIC:  Normal affect   ASSESSMENT:    1. Essential hypertension   2. Type 2 diabetes mellitus with diabetic peripheral angiopathy without gangrene, without long-term current use of insulin ([place])   3. Primary hypertension   4. Peripheral vascular disease ([place])   5. Dyspnea on  exertion    PLAN:    In order of problems listed above:  Peripheral vascular disease noncritical.  I will continue present management which include antiplatelets therapy as well as statin. Type 2 diabetes followed by antimedicine team last hemoglobin A1c is from last year which is 7.2 I will repeat his hemoglobin A1c today Dyslipidemia his K PN show me LDL of 75 HDL 35 this is from February of this year we will do the test he is already on statin moderate intensity simvastatin 80 which I will continue. Dyspnea on exertion denies having any   Medication Adjustments/Labs and Tests Ordered: Current medicines are reviewed at length with the patient today.  Concerns regarding medicines are outlined above.  No orders of the defined types were placed in this encounter.  Medication changes: No orders of the defined types were placed in this encounter.   Signed, Park Liter, MD, [facility] 10/25/2020 2:35 PM    [facility]

## 2020-10-25 NOTE — Patient Instructions (Signed)
Medication Instructions:  Your physician recommends that you continue on your current medications as directed. Please refer to the Current Medication list given to you today.  *If you need a refill on your cardiac medications before your next appointment, please call your pharmacy*   Lab Work: Your physician recommends that you return for lab work today: lipid, hemoglobin a 1 c, cmp   If you have labs (blood work) drawn today and your tests are completely normal, you will receive your results only by: [place] (if you have MyChart) OR A paper copy in the mail If you have any lab test that is abnormal or we need to change your treatment, we will call you to review the results.   Testing/Procedures: None   Follow-Up: At [facility], you and your health needs are our priority.  As part of our continuing mission to provide you with exceptional heart care, we have created designated Provider Care Teams.  These Care Teams include your primary Cardiologist (physician) and Advanced Practice Providers (APPs -  Physician Assistants and Nurse Practitioners) who all work together to provide you with the care you need, when you need it.  We recommend signing up for the patient portal called "MyChart".  Sign up information is provided on this After Visit Summary.  MyChart is used to connect with patients for Virtual Visits (Telemedicine).  Patients are able to view lab/test results, encounter notes, upcoming appointments, etc.  Non-urgent messages can be sent to your provider as well.   To learn more about what you can do with MyChart, go to [email or web address].    Your next appointment:   6 month(s)  The format for your next appointment:   In Person  Provider:   Jenne Campus, MD   Other Instructions

## 2020-10-25 NOTE — Addendum Note (Signed)
Addended by: Senaida Ores on: 10/25/2020 02:44 PM   Modules accepted: Orders

## 2020-10-26 DIAGNOSIS — I10 Essential (primary) hypertension: Secondary | ICD-10-CM | POA: Diagnosis not present

## 2020-10-26 DIAGNOSIS — E1151 Type 2 diabetes mellitus with diabetic peripheral angiopathy without gangrene: Secondary | ICD-10-CM | POA: Diagnosis not present

## 2020-10-26 DIAGNOSIS — I739 Peripheral vascular disease, unspecified: Secondary | ICD-10-CM | POA: Diagnosis not present

## 2020-10-26 DIAGNOSIS — R0600 Dyspnea, unspecified: Secondary | ICD-10-CM | POA: Diagnosis not present

## 2020-10-27 ENCOUNTER — Telehealth: Payer: Self-pay

## 2020-10-27 LAB — COMPREHENSIVE METABOLIC PANEL
ALT: 30 IU/L (ref 0–44)
AST: 20 IU/L (ref 0–40)
Albumin/Globulin Ratio: 2.1 (ref 1.2–2.2)
Albumin: 4.5 g/dL (ref 3.8–4.8)
Alkaline Phosphatase: 61 IU/L (ref 44–121)
BUN/Creatinine Ratio: 16 (ref 10–24)
BUN: 17 mg/dL (ref 8–27)
Bilirubin Total: 0.9 mg/dL (ref 0.0–1.2)
CO2: 22 mmol/L (ref 20–29)
Calcium: 9.3 mg/dL (ref 8.6–10.2)
Chloride: 100 mmol/L (ref 96–106)
Creatinine, Ser: 1.08 mg/dL (ref 0.76–1.27)
Globulin, Total: 2.1 g/dL (ref 1.5–4.5)
Glucose: 177 mg/dL — ABNORMAL HIGH (ref 70–99)
Potassium: 3.9 mmol/L (ref 3.5–5.2)
Sodium: 139 mmol/L (ref 134–144)
Total Protein: 6.6 g/dL (ref 6.0–8.5)
eGFR: 74 mL/min/{1.73_m2} (ref >59)

## 2020-10-27 LAB — LIPID PANEL
Chol/HDL Ratio: 4.1 ratio (ref 0.0–5.0)
Cholesterol, Total: 140 mg/dL (ref 100–199)
HDL: 34 mg/dL — ABNORMAL LOW (ref >39)
LDL Chol Calc (NIH): 68 mg/dL (ref 0–99)
Triglycerides: 231 mg/dL — ABNORMAL HIGH (ref 0–149)
VLDL Cholesterol Cal: 38 mg/dL (ref 5–40)

## 2020-10-27 LAB — HEMOGLOBIN A1C
Est. average glucose Bld gHb Est-mCnc: 203 mg/dL
Hgb A1c MFr Bld: 8.7 % — ABNORMAL HIGH (ref 4.8–5.6)

## 2020-10-27 NOTE — Telephone Encounter (Signed)
-----   Message from Park Liter, MD sent at 10/27/2020 10:48 AM EDT ----- Cholesterol is good however there are multiple markers suggesting his diabetes not well controlled, his hemoglobin A1c is highest ever 8.7.

## 2020-10-27 NOTE — Telephone Encounter (Signed)
Patient notified of his results. Results forward to PCP per his request and a copy available to pick up at the front desk as requested. Advised to follow up with PCP.

## 2021-01-02 DIAGNOSIS — E119 Type 2 diabetes mellitus without complications: Secondary | ICD-10-CM | POA: Diagnosis not present

## 2021-01-02 DIAGNOSIS — I10 Essential (primary) hypertension: Secondary | ICD-10-CM | POA: Diagnosis not present

## 2021-01-02 DIAGNOSIS — Z125 Encounter for screening for malignant neoplasm of prostate: Secondary | ICD-10-CM | POA: Diagnosis not present

## 2021-02-27 ENCOUNTER — Other Ambulatory Visit: Payer: Self-pay | Admitting: Cardiology

## 2021-05-17 ENCOUNTER — Ambulatory Visit (INDEPENDENT_AMBULATORY_CARE_PROVIDER_SITE_OTHER): Payer: HMO | Admitting: Cardiology

## 2021-05-17 ENCOUNTER — Encounter: Payer: Self-pay | Admitting: Cardiology

## 2021-05-17 VITALS — BP 126/80 | HR 80 | Ht 68.0 in | Wt 208.4 lb

## 2021-05-17 DIAGNOSIS — R0789 Other chest pain: Secondary | ICD-10-CM

## 2021-05-17 DIAGNOSIS — E1151 Type 2 diabetes mellitus with diabetic peripheral angiopathy without gangrene: Secondary | ICD-10-CM

## 2021-05-17 DIAGNOSIS — R0609 Other forms of dyspnea: Secondary | ICD-10-CM | POA: Diagnosis not present

## 2021-05-17 DIAGNOSIS — E782 Mixed hyperlipidemia: Secondary | ICD-10-CM

## 2021-05-17 DIAGNOSIS — I739 Peripheral vascular disease, unspecified: Secondary | ICD-10-CM

## 2021-05-17 DIAGNOSIS — I10 Essential (primary) hypertension: Secondary | ICD-10-CM

## 2021-05-17 NOTE — Progress Notes (Signed)
?Cardiology Office Note:   ? ?Date:  05/17/2021  ? ?ID:  Jason Thornton, DOB 27-Apr-1950, MRN [id number] ? ?PCP:  Manfred Shirts, PA  ?Cardiologist:  Jenne Campus, MD   ? ?Referring MD: Manfred Shirts, PA  ? ?Chief Complaint  ?Patient presents with  ? Follow-up  ?I am doing great ? ?History of Present Illness:   ? ?Jason Thornton is a 71 y.o. male with past medical history significant for diabetes, essential hypertension, dyslipidemia, peripheral vascular disease.  I did see him last time he was complaining of being weak tired and exhausted.  We did echocardiogram which showed preserved left ventricle ejection fraction.  Because he had some pain in his legs we did vascular studies of lower extremities which did not not show any significant stenosis.  He is coming today to my office for follow-up.  He retired and he said he is feeling great he said he have not feel so great for long time.  He is however much more busy than when he was working.  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 ?Past Medical History:  ?Diagnosis Date  ? Abnormal EKG 11/25/2017  ? Arthritis pain, hand 10/10/2015  ? Atypical chest pain 11/25/2017  ? Claudication in peripheral vascular disease ([name]) 04/20/2014  ? Formatting of this note might be different from the original. Non-obstructing carotids. Non-obstructing carotids.  Formatting of this note might be different from the original. Non-obstructing carotids. Formatting of this note might be different from the original. Overview:  Non-obstructing carotids.  ? Diabetes mellitus without complication ([place])   ? Dyslipidemia (high LDL; low HDL) 03/13/2016  ? Dyspnea on exertion 12/06/2019  ? Elevated hemoglobin A1c 11/25/2017  ? Essential hypertension 04/20/2014  ? Fatty liver   ? Gastroesophageal reflux disease without esophagitis 04/20/2014  ? GERD (gastroesophageal reflux disease)   ? Hyperlipidemia   ? Hypertension   ? Murmur, cardiac 07/04/2017  ? Pain of toe of right foot 06/23/2019  ?  Peripheral vascular disease ([name]) 04/20/2014  ? Formatting of this note might be different from the original. Non-obstructing carotids. Non-obstructing carotids.  Formatting of this note might be different from the original. Non-obstructing carotids. Formatting of this note might be different from the original. Overview:  Non-obstructing carotids.  ? Pure hypercholesterolemia 04/20/2014  ? Type 2 diabetes mellitus with diabetic peripheral angiopathy without gangrene, without long-term current use of insulin ([name]) 02/01/2019  ? Last Assessment & Plan:  Formatting of this note might be different from the original.  ? Umbilical hernia 5/0/5397  ? ? ?Past Surgical History:  ?Procedure Laterality Date  ? COLONOSCOPY  02/25/2011  ? Mild sigmoid diverticulosis. Small internal hemorrhoids. Otherwise normal colonoscopy  ? ESOPHAGOGASTRODUODENOSCOPY  11/18/2012  ? Presbyesophagus. Hiatal hernia  ? HERNIA REPAIR  2017  ? LYMPHADENECTOMY  10/2006  ? UPPER GASTROINTESTINAL ENDOSCOPY    ? ? ?Current Medications: ?Current Meds  ?Medication Sig  ? aspirin EC 81 MG tablet Take 1 tablet by mouth daily.  ? Docusate Sodium (COLACE PO) Take 1 tablet by mouth daily as needed (constipation).  ? glimepiride (AMARYL) 2 MG tablet Take 2 mg by mouth 2 (two) times daily.  ? lisinopril-hydrochlorothiazide (PRINZIDE,ZESTORETIC) 20-12.5 MG tablet Take 1 tablet by mouth daily.  ? Omega-3 Fatty Acids (FISH OIL PO) Take 1 tablet by mouth daily.  ? simvastatin (ZOCOR) 80 MG tablet TAKE 1 TABLET BY MOUTH EVERY DAY (Patient taking differently: Take 80 mg by mouth daily at 6 PM.)  ? ?  Current Facility-Administered Medications for the 05/17/21 encounter (Office Visit) with Park Liter, MD  ?Medication  ? 0.9 %  sodium chloride infusion  ?  ? ?Allergies:   Niacin  ? ?Social History  ? ?Socioeconomic History  ? Marital status: Married  ?  Spouse name: Not on file  ? Number of children: Not on file  ? Years of education: Not on file  ? Highest education  level: Not on file  ?Occupational History  ? Not on file  ?Tobacco Use  ? Smoking status: Never  ? Smokeless tobacco: Never  ?Vaping Use  ? Vaping Use: Never used  ?Substance and Sexual Activity  ? Alcohol use: Not Currently  ? Drug use: Never  ? Sexual activity: Not on file  ?Other Topics Concern  ? Not on file  ?Social History Narrative  ? Not on file  ? ?Social Determinants of Health  ? ?Financial Resource Strain: Not on file  ?Food Insecurity: Not on file  ?Transportation Needs: Not on file  ?Physical Activity: Not on file  ?Stress: Not on file  ?Social Connections: Not on file  ?  ? ?Family History: ?The patient's family history includes Alzheimer's disease in his mother; Breast cancer in his mother; Heart attack in his father; Prostate cancer in his brother and father. There is no history of Colon cancer, Esophageal cancer, Rectal cancer, or Stomach cancer. ?ROS:   ?Please see the history of present illness.    ?All 14 point review of systems negative except as described per history of present illness ? ?EKGs/Labs/Other Studies Reviewed:   ? ? ? ?Recent Labs: ?10/26/2020: ALT 30; BUN 17; Creatinine, Ser 1.08; Potassium 3.9; Sodium 139  ?Recent Lipid Panel ?   ?Component Value Date/Time  ? CHOL 140 10/26/2020 0842  ? TRIG 231 (H) 10/26/2020 1975  ? HDL 34 (L) 10/26/2020 0842  ? CHOLHDL 4.1 10/26/2020 0842  ? [place] 68 10/26/2020 0842  ? ? ?Physical Exam:   ? ?VS:  BP 126/80 (BP Location: Left Arm, Patient Position: Sitting)   Pulse 80   Ht 5' 8"  (1.727 m)   Wt 208 lb 6.4 oz (94.5 kg)   SpO2 98%   BMI 31.69 kg/m?    ? ?Wt Readings from Last 3 Encounters:  ?05/17/21 208 lb 6.4 oz (94.5 kg)  ?10/25/20 213 lb 9.6 oz (96.9 kg)  ?03/09/20 219 lb (99.3 kg)  ?  ? ?GEN:  Well nourished, well developed in no acute distress ?HEENT: Normal ?NECK: No JVD; No carotid bruits ?LYMPHATICS: No lymphadenopathy ?CARDIAC: RRR, no murmurs, no rubs, no gallops ?RESPIRATORY:  Clear to auscultation without rales, wheezing or  rhonchi  ?ABDOMEN: Soft, non-tender, non-distended ?MUSCULOSKELETAL:  No edema; No deformity  ?SKIN: Warm and dry ?LOWER EXTREMITIES: no swelling ?NEUROLOGIC:  Alert and oriented x 3 ?PSYCHIATRIC:  Normal affect  ? ?ASSESSMENT:   ? ?1. Atypical chest pain   ?2. Essential hypertension   ?3. Dyspnea on exertion   ?4. Peripheral vascular disease ([place])   ?5. Type 2 diabetes mellitus with diabetic peripheral angiopathy without gangrene, without long-term current use of insulin ([place])   ?6. Mixed hyperlipidemia   ? ?PLAN:   ? ?In order of problems listed above: ? ?Atypical chest pain denies having any doing well ?Essential hypertension blood pressure well controlled continue present management. ?Dyslipidemia I did review K PN which show me his LDL of 68 and HDL of 34 this is from September 2022.  We will continue present management. ?Type  2 diabetes again followed by internal medicine team last hemoglobin A1c 8.7 this is from September 2022 of Thornton he was told he need to take care of his diabetes better and he is trying to do that. ? ? ?Medication Adjustments/Labs and Tests Ordered: ?Current medicines are reviewed at length with the patient today.  Concerns regarding medicines are outlined above.  ?Orders Placed This Encounter  ?Procedures  ? EKG 12-Lead  ? ?Medication changes: No orders of the defined types were placed in this encounter. ? ? ?Signed, ?Park Liter, MD, [facility] ?05/17/2021 12:05 PM    ?[place] ?

## 2021-05-17 NOTE — Patient Instructions (Signed)
Medication Instructions:  Your physician recommends that you continue on your current medications as directed. Please refer to the Current Medication list given to you today.  *If you need a refill on your cardiac medications before your next appointment, please call your pharmacy*   Lab Work: None Ordered If you have labs (blood work) drawn today and your tests are completely normal, you will receive your results only by: MyChart Message (if you have MyChart) OR A paper copy in the mail If you have any lab test that is abnormal or we need to change your treatment, we will call you to review the results.   Testing/Procedures: None Ordered   Follow-Up: At [facility], you and your health needs are our priority.  As part of our continuing mission to provide you with exceptional heart care, we have created designated Provider Care Teams.  These Care Teams include your primary Cardiologist (physician) and Advanced Practice Providers (APPs -  Physician Assistants and Nurse Practitioners) who all work together to provide you with the care you need, when you need it.  We recommend signing up for the patient portal called "MyChart".  Sign up information is provided on this After Visit Summary.  MyChart is used to connect with patients for Virtual Visits (Telemedicine).  Patients are able to view lab/test results, encounter notes, upcoming appointments, etc.  Non-urgent messages can be sent to your provider as well.   To learn more about what you can do with MyChart, go to [email or web address].    Your next appointment:   12 month(s)  The format for your next appointment:   In Person  Provider:   Conway Krasowski, MD    Other Instructions NA  

## 2021-08-01 ENCOUNTER — Other Ambulatory Visit: Payer: Self-pay

## 2021-08-01 ENCOUNTER — Emergency Department (HOSPITAL_BASED_OUTPATIENT_CLINIC_OR_DEPARTMENT_OTHER)
Admission: EM | Admit: 2021-08-01 | Discharge: 2021-08-01 | Disposition: A | Discharge status: Home / Self Care | Payer: PPO | Attending: Emergency Medicine | Admitting: Emergency Medicine

## 2021-08-01 ENCOUNTER — Emergency Department (HOSPITAL_BASED_OUTPATIENT_CLINIC_OR_DEPARTMENT_OTHER): Payer: PPO

## 2021-08-01 DIAGNOSIS — Z7982 Long term (current) use of aspirin: Secondary | ICD-10-CM | POA: Insufficient documentation

## 2021-08-01 DIAGNOSIS — E119 Type 2 diabetes mellitus without complications: Secondary | ICD-10-CM | POA: Diagnosis not present

## 2021-08-01 DIAGNOSIS — R0602 Shortness of breath: Secondary | ICD-10-CM | POA: Diagnosis present

## 2021-08-01 DIAGNOSIS — Z20822 Contact with and (suspected) exposure to covid-19: Secondary | ICD-10-CM | POA: Insufficient documentation

## 2021-08-01 DIAGNOSIS — K219 Gastro-esophageal reflux disease without esophagitis: Secondary | ICD-10-CM | POA: Diagnosis not present

## 2021-08-01 DIAGNOSIS — D696 Thrombocytopenia, unspecified: Secondary | ICD-10-CM

## 2021-08-01 DIAGNOSIS — F419 Anxiety disorder, unspecified: Secondary | ICD-10-CM | POA: Insufficient documentation

## 2021-08-01 LAB — CBC WITH DIFFERENTIAL/PLATELET
Abs Immature Granulocytes: 0.03 10*3/uL (ref 0.00–0.07)
Basophils Absolute: 0 10*3/uL (ref 0.0–0.1)
Basophils Relative: 1 %
Eosinophils Absolute: 0.1 10*3/uL (ref 0.0–0.5)
Eosinophils Relative: 2 %
HCT: 42.7 % (ref 39.0–52.0)
Hemoglobin: 14.9 g/dL (ref 13.0–17.0)
Immature Granulocytes: 1 %
Lymphocytes Relative: 17 %
Lymphs Abs: 1 10*3/uL (ref 0.7–4.0)
MCH: 30.7 pg (ref 26.0–34.0)
MCHC: 34.9 g/dL (ref 30.0–36.0)
MCV: 87.9 fL (ref 80.0–100.0)
Monocytes Absolute: 0.7 10*3/uL (ref 0.1–1.0)
Monocytes Relative: 12 %
Neutro Abs: 4 10*3/uL (ref 1.7–7.7)
Neutrophils Relative %: 67 %
Platelets: 145 10*3/uL — ABNORMAL LOW (ref 150–400)
RBC: 4.86 MIL/uL (ref 4.22–5.81)
RDW: 13.5 % (ref 11.5–15.5)
WBC: 5.8 10*3/uL (ref 4.0–10.5)
nRBC: 0 % (ref 0.0–0.2)

## 2021-08-01 LAB — BASIC METABOLIC PANEL
Anion gap: 8 (ref 5–15)
BUN: 15 mg/dL (ref 8–23)
CO2: 25 mmol/L (ref 22–32)
Calcium: 9.1 mg/dL (ref 8.9–10.3)
Chloride: 104 mmol/L (ref 98–111)
Creatinine, Ser: 0.97 mg/dL (ref 0.61–1.24)
GFR, Estimated: >60 mL/min (ref >60)
Glucose, Bld: 191 mg/dL — ABNORMAL HIGH (ref 70–99)
Potassium: 3.6 mmol/L (ref 3.5–5.1)
Sodium: 137 mmol/L (ref 135–145)

## 2021-08-01 LAB — SARS CORONAVIRUS 2 BY RT PCR: SARS Coronavirus 2 by RT PCR: NEGATIVE

## 2021-08-01 LAB — TROPONIN I (HIGH SENSITIVITY): Troponin I (High Sensitivity): 8 ng/L (ref <18)

## 2021-08-01 MED ORDER — ALUM & MAG HYDROXIDE-SIMETH 200-200-20 MG/5ML PO SUSP
30.0000 mL | Freq: Once | ORAL | Status: AC
  Administered 2021-08-01: 30 mL via ORAL
  Filled 2021-08-01: qty 30

## 2021-08-01 MED ORDER — OMEPRAZOLE 20 MG PO CPDR: 20.0000 mg | DELAYED_RELEASE_CAPSULE | Freq: Every day | ORAL | 0 refills | Status: DC

## 2021-08-01 NOTE — ED Provider Notes (Signed)
[facility] EMERGENCY DEPARTMENT Provider Note   CSN: [id number] Arrival date & time: 08/01/21  0447     History  Chief Complaint  Patient presents with   Shortness of Breath    Staton Markey is a 71 y.o. male.  The history is provided by the patient.  Shortness of Breath Severity:  Moderate Onset quality:  Gradual Duration:  2 weeks Progression:  Waxing and waning Chronicity:  New Context comment:  Only when lying flat, and also feels panicked about it Relieved by:  Nothing Exacerbated by: lying flat since he doesn't have it any other time. Ineffective treatments: multiple antibiotics. Associated symptoms: no chest pain, no cough, no diaphoresis, no neck pain, no sputum production, no vomiting and no wheezing   Risk factors: no recent alcohol use, no hx of PE/DVT and no recent surgery   Patient with GERD, not currently taking any medications and DM presents for 2 weeks of SOB only when lying flat that wakes him up.  He now feels anxious about this since he has seen urgent care multiple times and has been on 2 antibiotics without change.  No exertional symptoms.  No leg pain no travel.       Home Medications Prior to Admission medications   Medication Sig Start Date End Date Taking? Authorizing Provider  omeprazole (PRILOSEC) 20 MG capsule Take 1 capsule (20 mg total) by mouth daily. 08/01/21  Yes Zaliah Wissner, MD  aspirin EC 81 MG tablet Take 1 tablet by mouth daily.    [provider]  Docusate Sodium (COLACE PO) Take 1 tablet by mouth daily as needed (constipation).    [provider]  glimepiride (AMARYL) 2 MG tablet Take 2 mg by mouth 2 (two) times daily. 09/12/18   [provider]  lisinopril-hydrochlorothiazide (PRINZIDE,ZESTORETIC) 20-12.5 MG tablet Take 1 tablet by mouth daily. 10/06/17   [provider]  Omega-3 Fatty Acids (FISH OIL PO) Take 1 tablet by mouth daily.    [provider]  simvastatin (ZOCOR) 80 MG  tablet TAKE 1 TABLET BY MOUTH EVERY DAY Patient taking differently: Take 80 mg by mouth daily at 6 PM. 02/27/21   Park Liter, MD      Allergies    Niacin    Review of Systems   Review of Systems  Constitutional:  Negative for diaphoresis.  HENT:  Negative for facial swelling.   Respiratory:  Positive for shortness of breath. Negative for cough, sputum production and wheezing.   Cardiovascular:  Negative for chest pain.  Gastrointestinal:  Negative for vomiting.  Musculoskeletal:  Negative for neck pain.  All other systems reviewed and are negative.   Physical Exam Updated Vital Signs BP (!) 152/86   Pulse 67   Temp 97.7 F (36.5 C) (Oral)   Resp 20   Ht 5' 8"  (1.727 m)   Wt 97.5 kg   SpO2 98%   BMI 32.69 kg/m  Physical Exam Vitals and nursing note reviewed. Exam conducted with a chaperone present.  Constitutional:      General: He is not in acute distress.    Appearance: He is well-developed. He is not diaphoretic.  HENT:     Head: Normocephalic and atraumatic.     Nose: Nose normal.  Eyes:     Conjunctiva/sclera: Conjunctivae normal.     Pupils: Pupils are equal, round, and reactive to light.  Cardiovascular:     Rate and Rhythm: Normal rate and regular rhythm.     Pulses:  Normal pulses.     Heart sounds: Normal heart sounds.  Pulmonary:     Effort: Pulmonary effort is normal.     Breath sounds: Normal breath sounds. No wheezing or rales.  Abdominal:     General: Bowel sounds are normal.     Palpations: Abdomen is soft.     Tenderness: There is no abdominal tenderness. There is no guarding or rebound.  Musculoskeletal:        General: Normal range of motion.     Cervical back: Normal range of motion and neck supple.  Skin:    General: Skin is warm and dry.     Capillary Refill: Capillary refill takes less than 2 seconds.  Neurological:     General: No focal deficit present.     Mental Status: He is alert and oriented to person, place, and time.      Deep Tendon Reflexes: Reflexes normal.  Psychiatric:        Mood and Affect: Mood normal.        Behavior: Behavior normal.     ED Results / Procedures / Treatments   Labs (all labs ordered are listed, but only abnormal results are displayed) Results for orders placed or performed during the hospital encounter of 08/01/21  SARS Coronavirus 2 by RT PCR (hospital order, performed in [place] hospital lab) *cepheid single result test* Anterior Nasal Swab   Specimen: Anterior Nasal Swab  Result Value Ref Range   SARS Coronavirus 2 by RT PCR NEGATIVE NEGATIVE  CBC with Differential  Result Value Ref Range   WBC 5.8 4.0 - 10.5 K/uL   RBC 4.86 4.22 - 5.81 MIL/uL   Hemoglobin 14.9 13.0 - 17.0 g/dL   HCT 42.7 39.0 - 52.0 %   MCV 87.9 80.0 - 100.0 fL   MCH 30.7 26.0 - 34.0 pg   MCHC 34.9 30.0 - 36.0 g/dL   RDW 13.5 11.5 - 15.5 %   Platelets 145 (L) 150 - 400 K/uL   nRBC 0.0 0.0 - 0.2 %   Neutrophils Relative % 67 %   Neutro Abs 4.0 1.7 - 7.7 K/uL   Lymphocytes Relative 17 %   Lymphs Abs 1.0 0.7 - 4.0 K/uL   Monocytes Relative 12 %   Monocytes Absolute 0.7 0.1 - 1.0 K/uL   Eosinophils Relative 2 %   Eosinophils Absolute 0.1 0.0 - 0.5 K/uL   Basophils Relative 1 %   Basophils Absolute 0.0 0.0 - 0.1 K/uL   Immature Granulocytes 1 %   Abs Immature Granulocytes 0.03 0.00 - 0.07 K/uL  Basic metabolic panel  Result Value Ref Range   Sodium 137 135 - 145 mmol/L   Potassium 3.6 3.5 - 5.1 mmol/L   Chloride 104 98 - 111 mmol/L   CO2 25 22 - 32 mmol/L   Glucose, Bld 191 (H) 70 - 99 mg/dL   BUN 15 8 - 23 mg/dL   Creatinine, Ser 0.97 0.61 - 1.24 mg/dL   Calcium 9.1 8.9 - 10.3 mg/dL   GFR, Estimated >60 >60 mL/min   Anion gap 8 5 - 15  Troponin I (High Sensitivity)  Result Value Ref Range   Troponin I (High Sensitivity) 8 <18 ng/L   DG Chest Portable 1 View  Result Date: 08/01/2021 CLINICAL DATA:  71 year old male with shortness of breath when lying down for 2 weeks. Chest pain.  EXAM: PORTABLE CHEST 1 VIEW COMPARISON:  Chest CTA 01/07/2020 and earlier. FINDINGS: Portable AP semi  upright view at 0513 hours. Low normal lung volumes, similar to prior exams. Mediastinal contours are stable and normal aside from tortuosity of the thoracic aorta. Allowing for portable technique the lungs are clear. No pneumothorax or pleural effusion. Visualized tracheal air column is within normal limits. No acute osseous abnormality identified. IMPRESSION: No acute cardiopulmonary abnormality. Electronically Signed   By: Genevie Ann M.D.   On: 08/01/2021 05:42     EKG EKG Interpretation  Date/Time:  Wednesday August 01 2021 05:10:02 EDT Ventricular Rate:  64 PR Interval:  237 QRS Duration: 98 QT Interval:  406 QTC Calculation: 419 R Axis:   -19 Text Interpretation: Sinus rhythm Prolonged PR interval Confirmed by Dory Horn) on 08/01/2021 5:52:51 AM  Radiology DG Chest Portable 1 View  Result Date: 08/01/2021 CLINICAL DATA:  71 year old male with shortness of breath when lying down for 2 weeks. Chest pain. EXAM: PORTABLE CHEST 1 VIEW COMPARISON:  Chest CTA 01/07/2020 and earlier. FINDINGS: Portable AP semi upright view at 0513 hours. Low normal lung volumes, similar to prior exams. Mediastinal contours are stable and normal aside from tortuosity of the thoracic aorta. Allowing for portable technique the lungs are clear. No pneumothorax or pleural effusion. Visualized tracheal air column is within normal limits. No acute osseous abnormality identified. IMPRESSION: No acute cardiopulmonary abnormality. Electronically Signed   By: Genevie Ann M.D.   On: 08/01/2021 05:42    Procedures Procedures    Medications Ordered in ED Medications  alum & mag hydroxide-simeth (MAALOX/MYLANTA) 200-200-20 MG/5ML suspension 30 mL (has no administration in time range)    ED Course/ Medical Decision Making/ A&P                           Medical Decision Making SOB only when lying flat.  No exertional  symptoms.  No CP.  Antibiotics not helping Symptoms making patient anxious  Problems Addressed: Anxiety:    Details: due to his health.  Follow up with PMD Gastroesophageal reflux disease, unspecified whether esophagitis present: acute illness or injury    Details: restarting PPI and gerd friendly diet.  Follow up with PMD.  that is the cause of symptoms lying flat. Thrombocytopenia ([place]): acute illness or injury    Details: No alcohol.  recheck with PMD.  Amount and/or Complexity of Data Reviewed External Data Reviewed: notes.    Details: previous notes reviewed. Labs: ordered.    Details: all labs reviewed:  negative covid.  Normal white count and hemoglobin, platelets 145k slightly low.  Normal sodium and potassium and creatinine, glucose elevated 191.  Normal troponin 8. Radiology: ordered and independent interpretation performed.    Details: normal CXR  Risk OTC drugs. Prescription drug management. Risk Details: Worked up for cardiac issues and CHF as well as covid.  No CHF based on vitals exam or cxr.  Will restart gerd friendly diet and start PPI.  Have advised it will take 7 days before relief of symptoms.  FOllow up with PMD.  Strict return precautions given     Final Clinical Impression(s) / ED Diagnoses Final diagnoses:  Gastroesophageal reflux disease, unspecified whether esophagitis present  Anxiety     Return for intractable cough, coughing up blood, fevers > 100.4 unrelieved by medication, shortness of breath, intractable vomiting, chest pain, shortness of breath, weakness, numbness, changes in speech, facial asymmetry, abdominal pain, passing out, Inability to tolerate liquids or food, cough, altered mental status or any concerns. No  signs of systemic illness or infection. The patient is nontoxic-appearing on exam and vital signs are within normal limits.  I have reviewed the triage vital signs and the nursing notes. Pertinent labs & imaging results that were available  during my care of the patient were reviewed by me and considered in my medical decision making (see chart for details). After history, exam, and medical workup I feel the patient has been appropriately medically screened and is safe for discharge home. Pertinent diagnoses were discussed with the patient. Patient was given return precautions.  Rx / DC Orders ED Discharge Orders          Ordered    omeprazole (PRILOSEC) 20 MG capsule  Daily        08/01/21 0553              Damieon Armendariz, MD 08/01/21 2524

## 2021-08-01 NOTE — ED Triage Notes (Signed)
Patient coming to ED for evaluation of SHOB "only when I am trying to lay down."  Symptoms started 2 weeks ago.  Has taken two antibiotics without improvement.  States "it causes anxiety because I can't get a good breath.   It is only when I lay down.  During the day I am ok."  No reports of fever or cough

## 2021-08-22 ENCOUNTER — Encounter: Payer: Self-pay | Admitting: Nurse Practitioner

## 2021-08-22 ENCOUNTER — Ambulatory Visit (INDEPENDENT_AMBULATORY_CARE_PROVIDER_SITE_OTHER): Payer: PPO | Admitting: Nurse Practitioner

## 2021-08-22 VITALS — BP 112/68 | HR 88 | Ht 67.5 in | Wt 215.0 lb

## 2021-08-22 DIAGNOSIS — K219 Gastro-esophageal reflux disease without esophagitis: Secondary | ICD-10-CM

## 2021-08-22 MED ORDER — OMEPRAZOLE 20 MG PO CPDR: 20.0000 mg | DELAYED_RELEASE_CAPSULE | Freq: Two times a day (BID) | ORAL | 3 refills | Status: DC

## 2021-08-22 NOTE — Progress Notes (Signed)
Chief Complaint:  ? Recurrent reflux   Assessment &  Plan   71 yo male with history of GERD able to be off medication for last two years. Now with frequent throat clearing, hoarse voice, and episodes of ? nocturnal regurgitation resulting in [facility] and panic attacks. Recent CXR, EKG, troponin and basic labs unrevealing in ED on 08/01/21. Discussed anti-reflux measures such as avoidance of late meals / bedtime snacks, HOB elevation (or use of wedge pillow), weight reduction / maintaining a healthy BMI ( body mass index),  and avoidance of trigger foods and caffeine.  Stressed importance of weight loss as he recently gained.  Continue current GERD medications including omeprazole 20 mg twice daily 30 minutes before meals Add famotidine 20 mg at bedtime Follow up here in a few weeks but call in the meantime if not continuing to get better and will probably arrange for an upper endoscopy.    HPI   Patient is was 71 yo male known to Dr. Lyndel Safe with a PMH of diverticulosis, GERD , HTN. HLD, PVD, DM, obesity.   Patient had a sinus infection in June , took Amoxicillin for 10 days. Then treated with Z-pak. After that developed recurrent reflux symptoms. He has a history of reflux but was able to be off medications for last few years.   Patient was seen in ED 7/5 with shortness of breath. CXR negative for acute abnormalities.  CBC normal. Troponin normal. Apparently felt to have GERD and was started on Omeprazole once daiy. He saw PCP a few days later and Omeprazole was increased to twice daily.   He feels like something is hung up in his throat requiring throat clearing. Also, he has been waking up with panic attacks feeling like he cannot breathe. However, during these episodes he hasn't had any obvious reflux / regurgitation like were his typical GERD symptoms in the past.   Though off GERD meds for last few years he still sleeps with the [facility] elevated and also sleeps on a wedge pillow.  He is going  to bed on a empty stomach. No caffeine use. Since starting Omeprazole things have improved. Since increasing dose to BID a couple of weeks ago things have further improved. The last two nights he has slept 7 hours and some nights 10 hours. However, the throat clearing hasn't improved.  He gives no history of heart failure. He is not [facility] when he lays down. He has gained weight but no swelling in legs. He is followed by Cardiology , last seen a couple of months ago.    Imaging   Echo Dec 2021 EF 60-65%   Labs:     Latest Ref Rng & Units 08/01/2021    5:10 AM 11/13/2018    8:07 AM  CBC  WBC 4.0 - 10.5 K/uL 5.8  6.8   Hemoglobin 13.0 - 17.0 g/dL 14.9  15.0   Hematocrit 39.0 - 52.0 % 42.7  44.3   Platelets 150 - 400 K/uL 145  181.0        Latest Ref Rng & Units 10/26/2020    8:42 AM 12/06/2019   10:58 AM 11/13/2018    8:07 AM  Hepatic Function  Total Protein 6.0 - 8.5 g/dL 6.6  7.0  7.1   Albumin 3.8 - 4.8 g/dL 4.5  4.9  4.4   AST 0 - 40 IU/L _0 ALT 0 - 44 IU/L 30  33  21  Alk Phosphatase 44 - 121 IU/L 61  69  59   Total Bilirubin 0.0 - 1.2 mg/dL 0.9  0.9  1.2      Past Medical History:  Diagnosis Date   Abnormal EKG 11/25/2017   Arthritis pain, hand 10/10/2015   Atypical chest pain 11/25/2017   Claudication in peripheral vascular disease ([place]) 04/20/2014   Formatting of this note might be different from the original. Non-obstructing carotids. Non-obstructing carotids.  Formatting of this note might be different from the original. Non-obstructing carotids. Formatting of this note might be different from the original. Overview:  Non-obstructing carotids.   Diabetes mellitus without complication ([place])    Dyslipidemia (high LDL; low HDL) 03/13/2016   Dyspnea on exertion 12/06/2019   Elevated hemoglobin A1c 11/25/2017   Essential hypertension 04/20/2014   Fatty liver    Gastroesophageal reflux disease without esophagitis 04/20/2014   GERD (gastroesophageal reflux disease)     Hyperlipidemia    Hypertension    Murmur, cardiac 07/04/2017   Pain of toe of right foot 06/23/2019   Peripheral vascular disease ([place]) 04/20/2014   Formatting of this note might be different from the original. Non-obstructing carotids. Non-obstructing carotids.  Formatting of this note might be different from the original. Non-obstructing carotids. Formatting of this note might be different from the original. Overview:  Non-obstructing carotids.   Pure hypercholesterolemia 04/20/2014   Type 2 diabetes mellitus with diabetic peripheral angiopathy without gangrene, without long-term current use of insulin ([place]) 02/01/2019   Last Assessment & Plan:  Formatting of this note might be different from the original.   Umbilical hernia 02/06/3788    Past Surgical History:  Procedure Laterality Date   COLONOSCOPY  02/25/2011   Mild sigmoid diverticulosis. Small internal hemorrhoids. Otherwise normal colonoscopy   ESOPHAGOGASTRODUODENOSCOPY  11/18/2012   Presbyesophagus. Hiatal hernia   HERNIA REPAIR  2017   LYMPHADENECTOMY  10/2006   UPPER GASTROINTESTINAL ENDOSCOPY      Current Medications, Allergies, Family History and Social History were reviewed in [employer] record.     Current Outpatient Medications  Medication Sig Dispense Refill   aspirin EC 81 MG tablet Take 1 tablet by mouth daily.     Docusate Sodium (COLACE PO) Take 1 tablet by mouth daily as needed (constipation).     glimepiride (AMARYL) 2 MG tablet Take 2 mg by mouth 2 (two) times daily.     lisinopril-hydrochlorothiazide (PRINZIDE,ZESTORETIC) 20-12.5 MG tablet Take 1 tablet by mouth daily.  3   Omega-3 Fatty Acids (FISH OIL PO) Take 1 tablet by mouth daily.     omeprazole (PRILOSEC) 20 MG capsule Take 1 capsule (20 mg total) by mouth daily. (Patient taking differently: Take 20 mg by mouth 2 (two) times daily before a meal.) 30 capsule 0   simvastatin (ZOCOR) 80 MG tablet TAKE 1 TABLET BY MOUTH EVERY DAY  (Patient taking differently: Take 80 mg by mouth daily at 6 PM.) 90 tablet 2   Current Facility-Administered Medications  Medication Dose Route Frequency Provider Last Rate Last Admin   0.9 %  sodium chloride infusion  500 mL Intravenous Once Jackquline Denmark, MD        Review of Systems: No chest pain. No abdominal pain.  No urinary complaints.    Physical Exam  Wt Readings from Last 3 Encounters:  08/22/21 215 lb (97.5 kg)  08/01/21 215 lb (97.5 kg)  05/17/21 208 lb 6.4 oz (94.5 kg)    BP 112/68 (BP Location: Left Arm, Patient  Position: Sitting, Cuff Size: Normal)   Pulse 88   Ht 5' 7.5" (1.715 m) Comment: height measured without shoes  Wt 215 lb (97.5 kg)   BMI 33.18 kg/m  Constitutional:  Generally well appearing, obese male in no acute distress. Psychiatric: Pleasant. Normal mood and affect. Behavior is normal. EENT: Pupils normal.  Conjunctivae are normal. No scleral icterus. Neck supple.  Cardiovascular: Normal rate, regular rhythm. No edema Pulmonary/chest: Effort normal and breath sounds normal. No wheezing, rales or rhonchi. Abdominal: Soft, nondistended, nontender. Bowel sounds active throughout. There are no masses palpable. No hepatomegaly. Neurological: Alert and oriented to person place and time. Skin: Skin is warm and dry. No rashes noted.  Tye Savoy, NP  08/22/2021, 3:19 PM

## 2021-08-22 NOTE — Patient Instructions (Addendum)
Continue Omeprazole 20 mg twice daily before breakfast and dinner Add Pepcid 20 mg at bedtime Work on below recommendations Will see you back in a few weeks but call us in the meantime if not continuing to get better and will probably arrange for an upper endoscopy.   Acid Reflux  Below are some measures you can take to possibly improve acid reflux symptoms . We may have discussed some of them today in the office. Not everything on this list may apply to you   --If you are taking anti-reflux ( GERD) medication be sure to take it 30 minutes before breakfast and if taking twice daily then also second dose should be 30 minutes before dinner.   --Avoid late meals / bedtime snacks.   --Avoid trigger foods ( foods which you know tend to aggravate you reflux symptoms). Some common trigger foods include spicy foods, fatty foods, acidic foods, chocolate and caffeine.  --Elevate the head of bed 6-8 inches on blocks or bricks. If not able to elevate the head of the bed consider purchasing a wedge pillow to sleep on.    --Weight reduction / maintain a healthy BMI ( body mass index) may be help with reflux symptoms  --Sometimes with the above mentioned "lifestyle changes" patients are able to reduce the amount of GERD medications they take. Our goal is to have you on the lowest effective dose of medication   If you are age 91 or older, your body mass index should be between 23-30. Your Body mass index is 33.18 kg/m. If this is out of the aforementioned range listed, please consider follow up with your Primary Care Provider.  If you are age 46 or younger, your body mass index should be between 19-25. Your Body mass index is 33.18 kg/m. If this is out of the aformentioned range listed, please consider follow up with your Primary Care Provider.   ________________________________________________________  The [place] [facility] providers would like to encourage you to use [facility] to communicate with providers for  non-urgent requests or questions.  Due to long hold times on the telephone, sending your provider a message by [facility] may be a faster and more efficient way to get a response.  Please allow 48 business hours for a response.  Please remember that this is for non-urgent requests.  _______________________________________________________   I appreciate the  opportunity to care for you  Thank You   [place]

## 2021-09-16 NOTE — Progress Notes (Signed)
Agree with assessment/plan.  Carmell Austria, MD [facility] [phone]

## 2021-09-21 ENCOUNTER — Ambulatory Visit: Payer: PPO | Admitting: Nurse Practitioner

## 2021-11-19 ENCOUNTER — Other Ambulatory Visit: Payer: Self-pay | Admitting: Nurse Practitioner

## 2021-11-23 ENCOUNTER — Other Ambulatory Visit: Payer: Self-pay | Admitting: Cardiology

## 2021-11-23 NOTE — Telephone Encounter (Signed)
Simvastatin refill sent to pharmacy

## 2022-02-13 ENCOUNTER — Ambulatory Visit (INDEPENDENT_AMBULATORY_CARE_PROVIDER_SITE_OTHER): Payer: PPO | Admitting: Gastroenterology

## 2022-02-13 ENCOUNTER — Encounter: Payer: Self-pay | Admitting: Gastroenterology

## 2022-02-13 VITALS — BP 146/90 | HR 78 | Ht 68.0 in | Wt 214.0 lb

## 2022-02-13 DIAGNOSIS — Z86010 Personal history of colonic polyps: Secondary | ICD-10-CM

## 2022-02-13 DIAGNOSIS — R1032 Left lower quadrant pain: Secondary | ICD-10-CM

## 2022-02-13 DIAGNOSIS — K219 Gastro-esophageal reflux disease without esophagitis: Secondary | ICD-10-CM

## 2022-02-13 MED ORDER — OMEPRAZOLE 20 MG PO CPDR: 20.0000 mg | DELAYED_RELEASE_CAPSULE | Freq: Every day | ORAL | 4 refills | Status: AC

## 2022-02-13 MED ORDER — OMEPRAZOLE 20 MG PO CPDR: 20.0000 mg | DELAYED_RELEASE_CAPSULE | Freq: Every day | ORAL | 4 refills | Status: DC

## 2022-02-13 NOTE — Patient Instructions (Addendum)
_______________________________________________________  If your blood pressure at your visit was 140/90 or greater, please contact your primary care physician to follow up on this.  _______________________________________________________  If you are age 72 or older, your body mass index should be between 23-30. Your Body mass index is 32.54 kg/m. If this is out of the aforementioned range listed, please consider follow up with your Primary Care Provider.  If you are age 24 or younger, your body mass index should be between 19-25. Your Body mass index is 32.54 kg/m. If this is out of the aformentioned range listed, please consider follow up with your Primary Care Provider.   ________________________________________________________  The [place] [facility] providers would like to encourage you to use [facility] to communicate with providers for non-urgent requests or questions.  Due to long hold times on the telephone, sending your provider a message by [facility] may be a faster and more efficient way to get a response.  Please allow 48 business hours for a response.  Please remember that this is for non-urgent requests.  _______________________________________________________  We have sent the following medications to your pharmacy for you to pick up at your convenience: Omeprazole  Continue colace.  Repeat colonoscopy for February 2025. Please call 2 months prior to schedule this. A letter will be sent as it gets closer.  Please call with any questions or concerns.  Thank you,  Dr. Jackquline Denmark

## 2022-02-13 NOTE — Progress Notes (Signed)
Chief Complaint: FU  Referring Provider:  Manfred Shirts, PA      ASSESSMENT AND PLAN;   #1. Recti diastasis. No recurrent umbilical hernia. Neg CT 10/2018 except for colonic div without diverticulitis.  #2. H/O polyps on colonoscopy 02/2020.  Next due in 3 years.  #3. GERD.    Plan:  -Continue omeprazole '20mg'$  po QD #90, 4RF -Continue Colace for now. -Rpt colon 03/2023 -If any further problems, CT AP with contrast. -Discussed with Jason Thornton   HPI:    Jason Thornton is a 72 y.o. male  For follow-up visit Wife - Jason Thornton  Doing well. Had divarication of recti/recti diastases-was concerned if it is a hernia.  He had umbilical hernia repaired October 2021 with mesh.  No recurrent hernias.  I have reassured him.  Small area in the lower abdomen along the belt line measuring 4 mm which occasionally gets tender once in couple of months.  Could be a small hernia or a sebaceous cyst.  No bowel in it.  Heating pads helps at times.  Have reassured him regarding that as well.  Has been taking Colace 1/day.  No further constipation.  Denies having any melena or hematochezia.  Denies having any upper GI symptoms.  Tolerating omeprazole well.   SH-married lives with spouse Jason Thornton  Past GI procedures: Colonoscopy 03/09/2020 -Colon polyps s/p polypectomy (TA) -Left colonic diverticulosis predominantly in the sigmoid colon. -Rpt 3 yrs  Colonoscopy Jan 2013-mild sigmoid diverticulosis, small internal hemorrhoids.   EGD 10/2012-small hiatal hernia, neg small bowel biopsies for celiac.  CT AP 10/2018-sigmoid diverticulosis without diverticulitis.  No other acute abnormalities. Past Medical History:  Diagnosis Date   Abnormal EKG 11/25/2017   Arthritis pain, hand 10/10/2015   Atypical chest pain 11/25/2017   Claudication in peripheral vascular disease ([place]) 04/20/2014   Formatting of this note might be different from the original. Non-obstructing carotids. Non-obstructing carotids.   Formatting of this note might be different from the original. Non-obstructing carotids. Formatting of this note might be different from the original. Overview:  Non-obstructing carotids.   Diabetes mellitus without complication ([place])    Dyslipidemia (high LDL; low HDL) 03/13/2016   Dyspnea on exertion 12/06/2019   Elevated hemoglobin A1c 11/25/2017   Essential hypertension 04/20/2014   Fatty liver    Gastroesophageal reflux disease without esophagitis 04/20/2014   GERD (gastroesophageal reflux disease)    Hyperlipidemia    Hypertension    Murmur, cardiac 07/04/2017   Pain of toe of right foot 06/23/2019   Peripheral vascular disease ([place]) 04/20/2014   Formatting of this note might be different from the original. Non-obstructing carotids. Non-obstructing carotids.  Formatting of this note might be different from the original. Non-obstructing carotids. Formatting of this note might be different from the original. Overview:  Non-obstructing carotids.   Pure hypercholesterolemia 04/20/2014   Type 2 diabetes mellitus with diabetic peripheral angiopathy without gangrene, without long-term current use of insulin ([place]) 02/01/2019   Last Assessment & Plan:  Formatting of this note might be different from the original.   Umbilical hernia 08/06/238    Past Surgical History:  Procedure Laterality Date   COLONOSCOPY  02/25/2011   Mild sigmoid diverticulosis. Small internal hemorrhoids. Otherwise normal colonoscopy   ESOPHAGOGASTRODUODENOSCOPY  11/18/2012   Presbyesophagus. Hiatal hernia   HERNIA REPAIR  2017   LYMPHADENECTOMY  10/2006   UPPER GASTROINTESTINAL ENDOSCOPY      Family History  Problem Relation Age of Onset   Alzheimer's disease Mother  Breast cancer Mother    Heart attack Father    Prostate cancer Father    Prostate cancer Brother    Colon cancer Neg Hx    Esophageal cancer Neg Hx    Rectal cancer Neg Hx    Stomach cancer Neg Hx     Social History   Tobacco Use   Smoking status:  Never   Smokeless tobacco: Never  Vaping Use   Vaping Use: Never used  Substance Use Topics   Alcohol use: Not Currently   Drug use: Never    Current Outpatient Medications  Medication Sig Dispense Refill   aspirin EC 81 MG tablet Take 1 tablet by mouth daily.     Docusate Sodium (COLACE PO) Take 1 tablet by mouth daily as needed (constipation).     glimepiride (AMARYL) 2 MG tablet Take 2 mg by mouth as directed. Take 2 tablets in the morning and 1 in the evening     lisinopril-hydrochlorothiazide (PRINZIDE,ZESTORETIC) 20-12.5 MG tablet Take 1 tablet by mouth daily.  3   Omega-3 Fatty Acids (FISH OIL PO) Take 1 tablet by mouth daily.     omeprazole (PRILOSEC) 20 MG capsule TAKE 1 CAPSULE (20 MG TOTAL) BY MOUTH 2 (TWO) TIMES DAILY BEFORE A MEAL. 180 capsule 1   simvastatin (ZOCOR) 80 MG tablet Take 1 tablet (80 mg total) by mouth daily at 6 PM. 90 tablet 2   Current Facility-Administered Medications  Medication Dose Route Frequency Provider Last Rate Last Admin   0.9 %  sodium chloride infusion  500 mL Intravenous Once Jackquline Denmark, MD        Allergies  Allergen Reactions   Niacin Hives and Rash    Review of Systems:  neg     Physical Exam:    BP (!) 146/90   Pulse 78   Ht '5\' 8"'$  (1.727 m)   Wt 214 lb (97.1 kg)   SpO2 97%   BMI 32.54 kg/m  Filed Weights   02/13/22 1045  Weight: 214 lb (97.1 kg)   Constitutional:  Well-developed, in no acute distress. Psychiatric: Normal mood and affect. Behavior is normal. Abdominal: Soft, nondistended. Nontender. Bowel sounds active throughout. There are no masses palpable. No hepatomegaly.  Recti diastases.  Small 4 mm hernia versus sebaceous cyst-midline lower abdomen.  Nontender. Neurological: Alert and oriented to person place and time. Skin: Skin is warm and dry. No rashes noted.  Data Reviewed: I have personally reviewed following labs and imaging studies  CBC:    Latest Ref Rng & Units 08/01/2021    5:10 AM 11/13/2018     8:07 AM  CBC  WBC 4.0 - 10.5 K/uL 5.8  6.8   Hemoglobin 13.0 - 17.0 g/dL 14.9  15.0   Hematocrit 39.0 - 52.0 % 42.7  44.3   Platelets 150 - 400 K/uL 145  181.0     CMP:    Latest Ref Rng & Units 08/01/2021    5:10 AM 10/26/2020    8:42 AM 01/03/2020   12:00 AM  CMP  Glucose 70 - 99 mg/dL 191  177  172   BUN 8 - 23 mg/dL '15  17  11   '$ Creatinine 0.61 - 1.24 mg/dL 0.97  1.08  1.03   Sodium 135 - 145 mmol/L 137  139  134   Potassium 3.5 - 5.1 mmol/L 3.6  3.9  4.0   Chloride 98 - 111 mmol/L 104  100  98   CO2 22 - 32  mmol/L '25  22  21   '$ Calcium 8.9 - 10.3 mg/dL 9.1  9.3  9.4   Total Protein 6.0 - 8.5 g/dL  6.6    Total Bilirubin 0.0 - 1.2 mg/dL  0.9    Alkaline Phos 44 - 121 IU/L  61    AST 0 - 40 IU/L  20    ALT 0 - 44 IU/L  30         Carmell Austria, MD 02/13/2022, 10:57 AM  Cc: Manfred Shirts, [place]

## 2022-06-06 ENCOUNTER — Ambulatory Visit: Payer: PPO | Attending: Cardiology | Admitting: Cardiology

## 2022-06-06 ENCOUNTER — Telehealth: Payer: Self-pay | Admitting: Cardiology

## 2022-06-06 ENCOUNTER — Encounter: Payer: Self-pay | Admitting: Cardiology

## 2022-06-06 VITALS — BP 138/80 | HR 71 | Ht 68.0 in | Wt 208.6 lb

## 2022-06-06 DIAGNOSIS — Z7985 Long-term (current) use of injectable non-insulin antidiabetic drugs: Secondary | ICD-10-CM

## 2022-06-06 DIAGNOSIS — E119 Type 2 diabetes mellitus without complications: Secondary | ICD-10-CM

## 2022-06-06 DIAGNOSIS — I10 Essential (primary) hypertension: Secondary | ICD-10-CM | POA: Diagnosis not present

## 2022-06-06 DIAGNOSIS — E785 Hyperlipidemia, unspecified: Secondary | ICD-10-CM

## 2022-06-06 DIAGNOSIS — E1151 Type 2 diabetes mellitus with diabetic peripheral angiopathy without gangrene: Secondary | ICD-10-CM | POA: Diagnosis not present

## 2022-06-06 DIAGNOSIS — I739 Peripheral vascular disease, unspecified: Secondary | ICD-10-CM

## 2022-06-06 DIAGNOSIS — R072 Precordial pain: Secondary | ICD-10-CM

## 2022-06-06 MED ORDER — METOPROLOL TARTRATE 100 MG PO TABS: ORAL_TABLET | ORAL | 0 refills | Status: DC

## 2022-06-06 MED ORDER — LISINOPRIL-HYDROCHLOROTHIAZIDE 20-12.5 MG PO TABS: 1.0000 | ORAL_TABLET | Freq: Every day | ORAL | 3 refills | Status: AC

## 2022-06-06 MED ORDER — SIMVASTATIN 80 MG PO TABS: 80.0000 mg | ORAL_TABLET | Freq: Every day | ORAL | 2 refills | Status: DC

## 2022-06-06 NOTE — Patient Instructions (Addendum)
Medication Instructions:   Metoprolol 100mg  1 tablet 2 hours prior to CT Scan  HOLD: Lisinopril- HCTZ morning of CT scan  *If you need a refill on your cardiac medications before your next appointment, please call your pharmacy*   Lab Work: Your physician recommends that you return for lab work in: 1 week prior to CT Scan You need to have labs done when you are fasting.  You can come Monday through Friday 8:30 am to 12:00 pm and 1:15 to 4:30. You do not need to make an appointment as the order has already been placed. The labs you are going to have done are BMET.    Testing/Procedures: Your physician has requested that you have cardiac CT. Cardiac computed tomography (CT) is a painless test that uses an x-ray machine to take clear, detailed pictures of your heart. For further information please visit [email or web address] Please follow instruction sheet as given.    Your Cardiac CT will be scheduled at:   [facility] located off [facility] at the hospital.  Please arrive 30 minutes prior to your appointment time.  You can use the FREE valet parking offered at entrance to outpatient center (encouraged to control the heart rate for the test)   Please follow these instructions carefully (unless otherwise directed):  Hold all erectile dysfunction medications at least 3 days (72 hrs) prior to test.  On the Night Before the Test: Be sure to Drink plenty of water. Do not consume any caffeinated/decaffeinated beverages or chocolate 12 hours prior to your test. Do not take any antihistamines 12 hours prior to your test.  On the Day of the Test: Drink plenty of water until 1 hour prior to the test. Do not eat any food 4 hours prior to the test. No smoking 4 hours prior to test. You may take your regular medications prior to the test.  Take metoprolol (Lopressor) two hours prior to test. HOLD Furosemide/Hydrochlorothiazide morning of the test.   After the  Test: Drink plenty of water. After receiving IV contrast, you may experience a mild flushed feeling. This is normal. On occasion, you may experience a mild rash up to 24 hours after the test. This is not dangerous. If this occurs, you can take Benadryl 25 mg and increase your fluid intake. If you experience trouble breathing, this can be serious. If it is severe call 911 IMMEDIATELY. If it is mild, please call our office. If you take any of these medications: Glipizide/Metformin, Avandament, Glucavance, please do not take 48 hours after completing test unless otherwise instructed.  We will call to schedule your test 2-4 weeks out understanding that some insurance companies will need an authorization prior to the service being performed.      Follow-Up: At [facility], you and your health needs are our priority.  As part of our continuing mission to provide you with exceptional heart care, we have created designated Provider Care Teams.  These Care Teams include your primary Cardiologist (physician) and Advanced Practice Providers (APPs -  Physician Assistants and Nurse Practitioners) who all work together to provide you with the care you need, when you need it.  We recommend signing up for the patient portal called "MyChart".  Sign up information is provided on this After Visit Summary.  MyChart is used to connect with patients for Virtual Visits (Telemedicine).  Patients are able to view lab/test results, encounter notes, upcoming appointments, etc.  Non-urgent messages can be sent to your provider as  well.   To learn more about what you can do with MyChart, go to [email or web address].    Your next appointment:   6 month(s)  The format for your next appointment:   In Person  Provider:   Gypsy Balsam, MD    Other Instructions Cardiac CT Angiogram A cardiac CT angiogram is a procedure to look at the heart and the area around the heart. It may be done to help find the cause  of chest pains or other symptoms of heart disease. During this procedure, a substance called contrast dye is injected into the blood vessels in the area to be checked. A large X-ray machine, called a CT scanner, then takes detailed pictures of the heart and the surrounding area. The procedure is also sometimes called a coronary CT angiogram, coronary artery scanning, or CTA. A cardiac CT angiogram allows the health care provider to see how well blood is flowing to and from the heart. The health care provider will be able to see if there are any problems, such as: Blockage or narrowing of the coronary arteries in the heart. Fluid around the heart. Signs of weakness or disease in the muscles, valves, and tissues of the heart. Tell a health care provider about: Any allergies you have. This is especially important if you have had a previous allergic reaction to contrast dye. All medicines you are taking, including vitamins, herbs, eye drops, creams, and over-the-counter medicines. Any blood disorders you have. Any surgeries you have had. Any medical conditions you have. Whether you are pregnant or may be pregnant. Any anxiety disorders, chronic pain, or other conditions you have that may increase your stress or prevent you from lying still. What are the risks? Generally, this is a safe procedure. However, problems may occur, including: Bleeding. Infection. Allergic reactions to medicines or dyes. Damage to other structures or organs. Kidney damage from the contrast dye that is used. Increased risk of cancer from radiation exposure. This risk is low. Talk with your health care provider about: The risks and benefits of testing. How you can receive the lowest dose of radiation. What happens before the procedure? Wear comfortable clothing and remove any jewelry, glasses, dentures, and hearing aids. Follow instructions from your health care provider about eating and drinking. This may include: For  12 hours before the procedure -- avoid caffeine. This includes tea, coffee, soda, energy drinks, and diet pills. Drink plenty of water or other fluids that do not have caffeine in them. Being well hydrated can prevent complications. For 4-6 hours before the procedure -- stop eating and drinking. The contrast dye can cause nausea, but this is less likely if your stomach is empty. Ask your health care provider about changing or stopping your regular medicines. This is especially important if you are taking diabetes medicines, blood thinners, or medicines to treat problems with erections (erectile dysfunction). What happens during the procedure?  Hair on your chest may need to be removed so that small sticky patches called electrodes can be placed on your chest. These will transmit information that helps to monitor your heart during the procedure. An IV will be inserted into one of your veins. You might be given a medicine to control your heart rate during the procedure. This will help to ensure that good images are obtained. You will be asked to lie on an exam table. This table will slide in and out of the CT machine during the procedure. Contrast dye will be injected into the IV.  You might feel warm, or you may get a metallic taste in your mouth. You will be given a medicine called nitroglycerin. This will relax or dilate the arteries in your heart. The table that you are lying on will move into the CT machine tunnel for the scan. The person running the machine will give you instructions while the scans are being done. You may be asked to: Keep your arms above your head. Hold your breath. Stay very still, even if the table is moving. When the scanning is complete, you will be moved out of the machine. The IV will be removed. The procedure may vary among health care providers and hospitals. What can I expect after the procedure? After your procedure, it is common to have: A metallic taste in your  mouth from the contrast dye. A feeling of warmth. A headache from the nitroglycerin. Follow these instructions at home: Take over-the-counter and prescription medicines only as told by your health care provider. If you are told, drink enough fluid to keep your urine pale yellow. This will help to flush the contrast dye out of your body. Most people can return to their normal activities right after the procedure. Ask your health care provider what activities are safe for you. It is up to you to get the results of your procedure. Ask your health care provider, or the department that is doing the procedure, when your results will be ready. Keep all follow-up visits as told by your health care provider. This is important. Contact a health care provider if: You have any symptoms of allergy to the contrast dye. These include: Shortness of breath. Rash or hives. A racing heartbeat. Summary A cardiac CT angiogram is a procedure to look at the heart and the area around the heart. It may be done to help find the cause of chest pains or other symptoms of heart disease. During this procedure, a large X-ray machine, called a CT scanner, takes detailed pictures of the heart and the surrounding area after a contrast dye has been injected into blood vessels in the area. Ask your health care provider about changing or stopping your regular medicines before the procedure. This is especially important if you are taking diabetes medicines, blood thinners, or medicines to treat erectile dysfunction. If you are told, drink enough fluid to keep your urine pale yellow. This will help to flush the contrast dye out of your body. This information is not intended to replace advice given to you by your health care provider. Make sure you discuss any questions you have with your health care provider. Document Revised: 05/03/2021 Document Reviewed: 09/09/2018 Elsevier Patient Education  2023 Else[employer] Inc.   Important  Information About Sugar

## 2022-06-06 NOTE — Telephone Encounter (Signed)
Patient's wife called saying some test were going to be schedule at [facility] for him. She said she would prefer for them to be done at the [facility] at [facility], she said they don't use [facility].

## 2022-06-06 NOTE — Telephone Encounter (Signed)
Spoke with pts spouse per DPR. She stated that they preferred to have the test at [facility]. Advised that we only do CT Angios at [facility] and [facility]. She and the pt decided to stay with [facility]. Will send records.

## 2022-06-06 NOTE — Progress Notes (Signed)
Cardiology Office Note:    Date:  06/06/2022   ID:  Jason Thornton, DOB 09/17/1950, MRN [id number]  PCP:  Shellia Cleverly, PA  Cardiologist:  Gypsy Balsam, MD    Referring MD: Shellia Cleverly, [place]   No chief complaint on file. I have chest pain  History of Present Illness:    Jason Thornton is a 72 y.o. male past medical history significant for diabetes essential hypertension dyslipidemia peripheral vascular disease.  He is coming to my office for follow-up again he complained of being weak tired exhausted.  He is retired and he injured his wrist when he works a lot he described to have some atypical chest pain located in the left side of his chest not related to exercise happening sometimes when he eats.  He does not have typical exertional chest pain.  Past Medical History:  Diagnosis Date   Abnormal EKG 11/25/2017   Arthritis pain, hand 10/10/2015   Atypical chest pain 11/25/2017   Claudication in peripheral vascular disease (HCC) 04/20/2014   Formatting of this note might be different from the original. Non-obstructing carotids. Non-obstructing carotids.  Formatting of this note might be different from the original. Non-obstructing carotids. Formatting of this note might be different from the original. Overview:  Non-obstructing carotids.   Diabetes mellitus without complication (HCC)    Dyslipidemia (high LDL; low HDL) 03/13/2016   Dyspnea on exertion 12/06/2019   Elevated hemoglobin A1c 11/25/2017   Essential hypertension 04/20/2014   Fatty liver    Gastroesophageal reflux disease without esophagitis 04/20/2014   GERD (gastroesophageal reflux disease)    Hyperlipidemia    Hypertension    Murmur, cardiac 07/04/2017   Pain of toe of right foot 06/23/2019   Peripheral vascular disease (HCC) 04/20/2014   Formatting of this note might be different from the original. Non-obstructing carotids. Non-obstructing carotids.  Formatting of this note might be different from the original.  Non-obstructing carotids. Formatting of this note might be different from the original. Overview:  Non-obstructing carotids.   Pure hypercholesterolemia 04/20/2014   Type 2 diabetes mellitus with diabetic peripheral angiopathy without gangrene, without long-term current use of insulin (HCC) 02/01/2019   Last Assessment & Plan:  Formatting of this note might be different from the original.   Umbilical hernia 07/04/2017    Past Surgical History:  Procedure Laterality Date   COLONOSCOPY  02/25/2011   Mild sigmoid diverticulosis. Small internal hemorrhoids. Otherwise normal colonoscopy   ESOPHAGOGASTRODUODENOSCOPY  11/18/2012   Presbyesophagus. Hiatal hernia   HERNIA REPAIR  2017   LYMPHADENECTOMY  10/2006   UPPER GASTROINTESTINAL ENDOSCOPY      Current Medications: Current Meds  Medication Sig   aspirin EC 81 MG tablet Take 1 tablet by mouth daily.   Docusate Sodium (COLACE PO) Take 1 tablet by mouth daily as needed (constipation).   glimepiride (AMARYL) 2 MG tablet Take 2 mg by mouth as directed. Take 2 tablets in the morning and 1 in the evening   lisinopril-hydrochlorothiazide (PRINZIDE,ZESTORETIC) 20-12.5 MG tablet Take 1 tablet by mouth daily.   Omega-3 Fatty Acids (FISH OIL PO) Take 1 tablet by mouth daily.   omeprazole (PRILOSEC) 20 MG capsule Take 1 capsule (20 mg total) by mouth daily.   simvastatin (ZOCOR) 80 MG tablet Take 1 tablet (80 mg total) by mouth daily at 6 PM.   Current Facility-Administered Medications for the 06/06/22 encounter (Office Visit) with Georgeanna Lea, MD  Medication   0.9 %  sodium chloride infusion  Allergies:   Niacin   Social History   Socioeconomic History   Marital status: Married    Spouse name: Not on file   Number of children: Not on file   Years of education: Not on file   Highest education level: Not on file  Occupational History   Not on file  Tobacco Use   Smoking status: Never   Smokeless tobacco: Never  Vaping Use    Vaping Use: Never used  Substance and Sexual Activity   Alcohol use: Not Currently   Drug use: Never   Sexual activity: Not on file  Other Topics Concern   Not on file  Social History Narrative   Not on file   Social Determinants of Health   Financial Resource Strain: Not on file  Food Insecurity: Not on file  Transportation Needs: Not on file  Physical Activity: Not on file  Stress: Not on file  Social Connections: Not on file     Family History: The patient's family history includes Alzheimer's disease in his mother; Breast cancer in his mother; Heart attack in his father; Prostate cancer in his brother and father. There is no history of Colon cancer, Esophageal cancer, Rectal cancer, or Stomach cancer. ROS:   Please see the history of present illness.    All 14 point review of systems negative except as described per history of present illness  EKGs/Labs/Other Studies Reviewed:      Recent Labs: 08/01/2021: BUN 15; Creatinine, Ser 0.97; Hemoglobin 14.9; Platelets 145; Potassium 3.6; Sodium 137  Recent Lipid Panel    Component Value Date/Time   CHOL 140 10/26/2020 0842   TRIG 231 (H) 10/26/2020 0842   HDL 34 (L) 10/26/2020 0842   CHOLHDL 4.1 10/26/2020 0842   LDLCALC 68 10/26/2020 0842    Physical Exam:    VS:  BP (!) 148/80 (BP Location: Left Arm, Patient Position: Sitting)   Pulse 71   Ht 5\' 8"  (1.727 m)   Wt 208 lb 9.6 oz (94.6 kg)   SpO2 97%   BMI 31.72 kg/m     Wt Readings from Last 3 Encounters:  06/06/22 208 lb 9.6 oz (94.6 kg)  02/13/22 214 lb (97.1 kg)  08/22/21 215 lb (97.5 kg)     GEN:  Well nourished, well developed in no acute distress HEENT: Normal NECK: No JVD; No carotid bruits LYMPHATICS: No lymphadenopathy CARDIAC: RRR, no murmurs, no rubs, no gallops RESPIRATORY:  Clear to auscultation without rales, wheezing or rhonchi  ABDOMEN: Soft, non-tender, non-distended MUSCULOSKELETAL:  No edema; No deformity  SKIN: Warm and dry LOWER  EXTREMITIES: no swelling NEUROLOGIC:  Alert and oriented x 3 PSYCHIATRIC:  Normal affect   ASSESSMENT:    1. Type 2 diabetes mellitus with diabetic peripheral angiopathy without gangrene, without long-term current use of insulin (HCC)   2. Peripheral vascular disease (HCC)   3. Essential hypertension   4. Diabetes mellitus without complication (HCC)   5. Dyslipidemia (high LDL; low HDL)    PLAN:    In order of problems listed above:  Type 2 diabetes living followed by antimedicine team.  I did review hemoglobin A1c last number I have however is from 2022 which was 8.7 clearly uncontrolled at the time, Lafonda Mosses reviewed the chart and find some labs done 4 months ago with hemoglobin A1c 7.5 better but still not where it supposed to be. Dyslipidemia he is taking Zocor 80, his LDL 54 HDL 39 triglyceride 2088 which is clearly related to poorly  controlled diabetes he need to do work better on his diabetes. Atypical chest pain: Will schedule him to have a coronary CT angio to rule out significant coronary disease. Essential hypertension blood pressure elevated today we will recheck before he leaves the room.  He tells me at home always good.  But will recheck before he get out of here   Medication Adjustments/Labs and Tests Ordered: Current medicines are reviewed at length with the patient today.  Concerns regarding medicines are outlined above.  No orders of the defined types were placed in this encounter.  Medication changes: No orders of the defined types were placed in this encounter.   Signed, Georgeanna Lea, MD, [facility] 06/06/2022 10:06 AM    [facility]

## 2022-06-06 NOTE — Addendum Note (Signed)
Addended by: Baldo Ash D on: 06/06/2022 10:33 AM   Modules accepted: Orders

## 2022-06-11 ENCOUNTER — Telehealth: Payer: Self-pay

## 2022-06-11 NOTE — Telephone Encounter (Signed)
Spoke with Jason Thornton, stated he will stop by tomorrow and draw labs for CT scheduled on 06/20/22.

## 2022-06-13 LAB — BASIC METABOLIC PANEL
BUN/Creatinine Ratio: 12 (ref 10–24)
BUN: 14 mg/dL (ref 8–27)
CO2: 25 mmol/L (ref 20–29)
Calcium: 9.5 mg/dL (ref 8.6–10.2)
Chloride: 100 mmol/L (ref 96–106)
Creatinine, Ser: 1.13 mg/dL (ref 0.76–1.27)
Glucose: 154 mg/dL — ABNORMAL HIGH (ref 70–99)
Potassium: 3.9 mmol/L (ref 3.5–5.2)
Sodium: 139 mmol/L (ref 134–144)
eGFR: 69 mL/min/{1.73_m2} (ref >59)

## 2022-06-19 ENCOUNTER — Telehealth: Payer: Self-pay

## 2022-06-19 NOTE — Telephone Encounter (Signed)
Spoke with Steward Drone, notified of results

## 2022-06-19 NOTE — Telephone Encounter (Signed)
-----   Message from Georgeanna Lea, MD sent at 06/17/2022 12:21 PM EDT ----- Chem-7 looks good

## 2022-06-25 ENCOUNTER — Telehealth: Payer: Self-pay | Admitting: Cardiology

## 2022-06-25 NOTE — Telephone Encounter (Signed)
Patient's wife is calling to get update on CT results. Requesting call back.

## 2022-06-25 NOTE — Telephone Encounter (Signed)
Spoke with Steward Drone, informed CT report not completed. Aware we will call as soon as Dr. Kirtland Bouchard has feed back.

## 2022-06-28 ENCOUNTER — Telehealth: Payer: Self-pay

## 2022-06-28 ENCOUNTER — Encounter: Payer: Self-pay | Admitting: Cardiology

## 2022-06-28 MED ORDER — NITROGLYCERIN 0.4 MG SL SUBL
0.4000 mg | SUBLINGUAL_TABLET | SUBLINGUAL | 6 refills | Status: AC | PRN
Start: 2022-06-28 — End: ?

## 2022-06-28 MED ORDER — METOPROLOL SUCCINATE ER 25 MG PO TB24: 25.0000 mg | ORAL_TABLET | Freq: Every day | ORAL | 3 refills | Status: DC

## 2022-06-28 NOTE — Telephone Encounter (Signed)
Spoke with pt and spouse regarding CT Angio results and Dr. Hulen Shouts note. They both verbalized understanding and had no further questions. Toprol XL and Nitroglycerin sent to [facility].

## 2022-06-28 NOTE — Telephone Encounter (Signed)
LVM for pt to call regarding CT Angio results.

## 2022-11-29 ENCOUNTER — Encounter: Payer: Self-pay | Admitting: Cardiology

## 2022-11-29 ENCOUNTER — Ambulatory Visit: Payer: PPO | Attending: Cardiology | Admitting: Cardiology

## 2022-11-29 VITALS — BP 120/72 | HR 74 | Ht 68.0 in | Wt 216.0 lb

## 2022-11-29 DIAGNOSIS — I2510 Atherosclerotic heart disease of native coronary artery without angina pectoris: Secondary | ICD-10-CM | POA: Diagnosis not present

## 2022-11-29 DIAGNOSIS — I739 Peripheral vascular disease, unspecified: Secondary | ICD-10-CM

## 2022-11-29 DIAGNOSIS — E1151 Type 2 diabetes mellitus with diabetic peripheral angiopathy without gangrene: Secondary | ICD-10-CM | POA: Diagnosis not present

## 2022-11-29 DIAGNOSIS — I10 Essential (primary) hypertension: Secondary | ICD-10-CM | POA: Diagnosis not present

## 2022-11-29 DIAGNOSIS — E119 Type 2 diabetes mellitus without complications: Secondary | ICD-10-CM

## 2022-11-29 NOTE — Addendum Note (Signed)
Addended by: Baldo Ash D on: 11/29/2022 02:33 PM   Modules accepted: Orders

## 2022-11-29 NOTE — Patient Instructions (Addendum)
Medication Instructions:  Your physician recommends that you continue on your current medications as directed. Please refer to the Current Medication list given to you today.  *If you need a refill on your cardiac medications before your next appointment, please call your pharmacy*   Lab Work: Your physician recommends that you return for lab work in: when fasting You need to have labs done when you are fasting.  You can come Monday through Friday 8:30 am to 12:00 pm and 1:15 to 4:30. You do not need to make an appointment as the order has already been placed. The labs you are going to have done are AST, ALT  Lipids.    Testing/Procedures: Your physician has requested that you have a carotid duplex. This test is an ultrasound of the carotid arteries in your neck. It looks at blood flow through these arteries that supply the brain with blood. Allow one hour for this exam. There are no restrictions or special instructions.    Follow-Up: At [facility], you and your health needs are our priority.  As part of our continuing mission to provide you with exceptional heart care, we have created designated Provider Care Teams.  These Care Teams include your primary Cardiologist (physician) and Advanced Practice Providers (APPs -  Physician Assistants and Nurse Practitioners) who all work together to provide you with the care you need, when you need it.  We recommend signing up for the patient portal called "MyChart".  Sign up information is provided on this After Visit Summary.  MyChart is used to connect with patients for Virtual Visits (Telemedicine).  Patients are able to view lab/test results, encounter notes, upcoming appointments, etc.  Non-urgent messages can be sent to your provider as well.   To learn more about what you can do with MyChart, go to [email or web address].    Your next appointment:   6 month(s)  The format for your next appointment:   In Person  Provider:   Gypsy Balsam, MD    Other Instructions NA

## 2022-11-29 NOTE — Progress Notes (Signed)
Cardiology Office Note:    Date:  11/29/2022   ID:  Jason Thornton, DOB 1950/12/02, MRN [id number]  PCP:  Shellia Cleverly, PA  Cardiologist:  Gypsy Balsam, MD    Referring MD: Shellia Cleverly, [place]   Chief Complaint  Patient presents with   Results    History of Present Illness:    Jason Thornton is a 72 y.o. male past medical history significant for diabetes, essential hypertension, dyslipidemia, peripheral vascular disease.  He came to with atypical symptoms being tired and exhausted.  We did coronary CT angio which showed moderate disease however FFR was negative.  He comes to talk about this.  He said he is doing great, he is asymptomatic, no chest pain tightness squeezing pressure mid chest  Past Medical History:  Diagnosis Date   Abnormal EKG 11/25/2017   Arthritis pain, hand 10/10/2015   Atypical chest pain 11/25/2017   Claudication in peripheral vascular disease (HCC) 04/20/2014   Formatting of this note might be different from the original. Non-obstructing carotids. Non-obstructing carotids.  Formatting of this note might be different from the original. Non-obstructing carotids. Formatting of this note might be different from the original. Overview:  Non-obstructing carotids.   Diabetes mellitus without complication (HCC)    Dyslipidemia (high LDL; low HDL) 03/13/2016   Dyspnea on exertion 12/06/2019   Elevated hemoglobin A1c 11/25/2017   Essential hypertension 04/20/2014   Fatty liver    Gastroesophageal reflux disease without esophagitis 04/20/2014   GERD (gastroesophageal reflux disease)    Hyperlipidemia    Hypertension    Murmur, cardiac 07/04/2017   Pain of toe of right foot 06/23/2019   Peripheral vascular disease (HCC) 04/20/2014   Formatting of this note might be different from the original. Non-obstructing carotids. Non-obstructing carotids.  Formatting of this note might be different from the original. Non-obstructing carotids. Formatting of this note might be  different from the original. Overview:  Non-obstructing carotids.   Pure hypercholesterolemia 04/20/2014   Type 2 diabetes mellitus with diabetic peripheral angiopathy without gangrene, without long-term current use of insulin (HCC) 02/01/2019   Last Assessment & Plan:  Formatting of this note might be different from the original.   Umbilical hernia 07/04/2017    Past Surgical History:  Procedure Laterality Date   COLONOSCOPY  02/25/2011   Mild sigmoid diverticulosis. Small internal hemorrhoids. Otherwise normal colonoscopy   ESOPHAGOGASTRODUODENOSCOPY  11/18/2012   Presbyesophagus. Hiatal hernia   HERNIA REPAIR  2017   LYMPHADENECTOMY  10/2006   UPPER GASTROINTESTINAL ENDOSCOPY      Current Medications: Current Meds  Medication Sig   aspirin EC 81 MG tablet Take 1 tablet by mouth daily.   Docusate Sodium (COLACE PO) Take 1 tablet by mouth daily as needed (constipation).   glimepiride (AMARYL) 2 MG tablet Take 2 mg by mouth as directed. Take 2 tablets in the morning and 1 in the evening   lisinopril-hydrochlorothiazide (ZESTORETIC) 20-12.5 MG tablet Take 1 tablet by mouth daily.   metoprolol succinate (TOPROL XL) 25 MG 24 hr tablet Take 1 tablet (25 mg total) by mouth daily.   nitroGLYCERIN (NITROSTAT) 0.4 MG SL tablet Place 1 tablet (0.4 mg total) under the tongue every 5 (five) minutes as needed for chest pain.   Omega-3 Fatty Acids (FISH OIL PO) Take 1 tablet by mouth daily.   omeprazole (PRILOSEC) 20 MG capsule Take 1 capsule (20 mg total) by mouth daily.   simvastatin (ZOCOR) 80 MG tablet Take 1 tablet (80 mg total) by  mouth daily at 6 PM.   Current Facility-Administered Medications for the 11/29/22 encounter (Office Visit) with Georgeanna Lea, MD  Medication   0.9 %  sodium chloride infusion     Allergies:   Niacin   Social History   Socioeconomic History   Marital status: Married    Spouse name: Not on file   Number of children: Not on file   Years of education: Not  on file   Highest education level: Not on file  Occupational History   Not on file  Tobacco Use   Smoking status: Never   Smokeless tobacco: Never  Vaping Use   Vaping status: Never Used  Substance and Sexual Activity   Alcohol use: Not Currently   Drug use: Never   Sexual activity: Not on file  Other Topics Concern   Not on file  Social History Narrative   Not on file   Social Determinants of Health   Financial Resource Strain: Not on file  Food Insecurity: Not on file  Transportation Needs: Not on file  Physical Activity: Not on file  Stress: Not on file  Social Connections: Unknown (06/11/2021)   [facility] from [facility], [facility]   Social Network    Social Network: Not on file     Family History: The patient's family history includes Alzheimer's disease in his mother; Breast cancer in his mother; Heart attack in his father; Prostate cancer in his brother and father. There is no history of Colon cancer, Esophageal cancer, Rectal cancer, or Stomach cancer. ROS:   Please see the history of present illness.    All 14 point review of systems negative except as described per history of present illness  EKGs/Labs/Other Studies Reviewed:         Recent Labs: 06/12/2022: BUN 14; Creatinine, Ser 1.13; Potassium 3.9; Sodium 139  Recent Lipid Panel    Component Value Date/Time   CHOL 140 10/26/2020 0842   TRIG 231 (H) 10/26/2020 0842   HDL 34 (L) 10/26/2020 0842   CHOLHDL 4.1 10/26/2020 0842   LDLCALC 68 10/26/2020 0842    Physical Exam:    VS:  BP 120/72 (BP Location: Left Arm, Patient Position: Sitting)   Pulse 74   Ht 5\' 8"  (1.727 m)   Wt 216 lb (98 kg)   SpO2 97%   BMI 32.84 kg/m     Wt Readings from Last 3 Encounters:  11/29/22 216 lb (98 kg)  06/06/22 208 lb 9.6 oz (94.6 kg)  02/13/22 214 lb (97.1 kg)     GEN:  Well nourished, well developed in no acute distress HEENT: Normal NECK: No JVD; No carotid bruits LYMPHATICS: No  lymphadenopathy CARDIAC: RRR, no murmurs, no rubs, no gallops RESPIRATORY:  Clear to auscultation without rales, wheezing or rhonchi  ABDOMEN: Soft, non-tender, non-distended MUSCULOSKELETAL:  No edema; No deformity  SKIN: Warm and dry LOWER EXTREMITIES: no swelling NEUROLOGIC:  Alert and oriented x 3 PSYCHIATRIC:  Normal affect   ASSESSMENT:    1. Coronary artery disease involving native coronary artery of native heart without angina pectoris   2. Essential hypertension   3. Peripheral vascular disease (HCC)   4. Type 2 diabetes mellitus with diabetic peripheral angiopathy without gangrene, without long-term current use of insulin (HCC)   5. Diabetes mellitus without complication (HCC)    PLAN:    In order of problems listed above:  Coronary disease nonobstructive we will modify his risk factors.  He is not probably done as  elective medical therapy which I will continue. Dyslipidemia I do have data from 2022.  Will recheck his fasting lipid profile. Peripheral vascular disease 1 area more about his coronary artery.  There is some fluid.  I will schedule him to have carotic ultrasound. Type 2 diabetes that being followed by primary care physician   Medication Adjustments/Labs and Tests Ordered: Current medicines are reviewed at length with the patient today.  Concerns regarding medicines are outlined above.  No orders of the defined types were placed in this encounter.  Medication changes: No orders of the defined types were placed in this encounter.   Signed, Georgeanna Lea, MD, [facility] 11/29/2022 2:19 PM    [facility]

## 2022-12-10 ENCOUNTER — Other Ambulatory Visit: Payer: Self-pay | Admitting: Cardiology

## 2022-12-10 NOTE — Telephone Encounter (Signed)
Rx refill sent to pharmacy. 

## 2022-12-25 ENCOUNTER — Ambulatory Visit: Payer: PPO | Attending: Cardiology

## 2022-12-25 DIAGNOSIS — I2510 Atherosclerotic heart disease of native coronary artery without angina pectoris: Secondary | ICD-10-CM | POA: Diagnosis not present

## 2022-12-25 LAB — LIPID PANEL
Chol/HDL Ratio: 4.8 {ratio} (ref 0.0–5.0)
Cholesterol, Total: 164 mg/dL (ref 100–199)
HDL: 34 mg/dL — ABNORMAL LOW (ref >39)
LDL Chol Calc (NIH): 90 mg/dL (ref 0–99)
Triglycerides: 238 mg/dL — ABNORMAL HIGH (ref 0–149)
VLDL Cholesterol Cal: 40 mg/dL (ref 5–40)

## 2022-12-25 LAB — ALT: ALT: 19 [IU]/L (ref 0–44)

## 2022-12-25 LAB — AST: AST: 15 [IU]/L (ref 0–40)

## 2022-12-31 ENCOUNTER — Telehealth: Payer: Self-pay | Admitting: Cardiology

## 2022-12-31 NOTE — Telephone Encounter (Signed)
Spoke with spouse. Advised that labs are available but Dr. Bing Matter has not made any remarks yet. Advised that We will call when they are available. She verbalized understanding and had no further questions.

## 2022-12-31 NOTE — Telephone Encounter (Signed)
  Wife was with patient calling to follow up on the results of his lab work. Please advise

## 2023-01-09 ENCOUNTER — Telehealth: Payer: Self-pay

## 2023-01-09 DIAGNOSIS — E785 Hyperlipidemia, unspecified: Secondary | ICD-10-CM

## 2023-01-09 MED ORDER — EZETIMIBE 10 MG PO TABS: 10.0000 mg | ORAL_TABLET | Freq: Every day | ORAL | 3 refills | Status: DC

## 2023-01-09 NOTE — Telephone Encounter (Signed)
-----   Message from Gypsy Balsam sent at 01/01/2023  8:33 AM EST ----- Cholesterol is not perfect.  Please add Zetia 10 mg daily to medical regiment, fasting lipid profile, AST ALT 6 weeks

## 2023-01-09 NOTE — Telephone Encounter (Signed)
Patient notified of results and recommendations and agreed with plan, medication sent, lab order on file.   

## 2023-01-09 NOTE — Telephone Encounter (Signed)
Spoke to [place], notified of results

## 2023-01-09 NOTE — Telephone Encounter (Signed)
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  Medical therapy

## 2023-05-29 ENCOUNTER — Encounter: Payer: Self-pay | Admitting: Gastroenterology

## 2023-05-30 ENCOUNTER — Encounter: Payer: Self-pay | Admitting: Gastroenterology

## 2023-06-12 ENCOUNTER — Ambulatory Visit: Admitting: Cardiology

## 2023-07-08 ENCOUNTER — Other Ambulatory Visit: Payer: Self-pay | Admitting: Cardiology

## 2023-09-02 ENCOUNTER — Ambulatory Visit: Attending: Cardiology | Admitting: Cardiology

## 2023-09-02 ENCOUNTER — Encounter: Payer: Self-pay | Admitting: Cardiology

## 2023-09-02 VITALS — BP 152/80 | HR 78 | Ht 68.0 in | Wt 212.6 lb

## 2023-09-02 DIAGNOSIS — I10 Essential (primary) hypertension: Secondary | ICD-10-CM

## 2023-09-02 DIAGNOSIS — I2510 Atherosclerotic heart disease of native coronary artery without angina pectoris: Secondary | ICD-10-CM | POA: Diagnosis not present

## 2023-09-02 DIAGNOSIS — E782 Mixed hyperlipidemia: Secondary | ICD-10-CM

## 2023-09-02 DIAGNOSIS — E785 Hyperlipidemia, unspecified: Secondary | ICD-10-CM | POA: Diagnosis not present

## 2023-09-02 DIAGNOSIS — E1151 Type 2 diabetes mellitus with diabetic peripheral angiopathy without gangrene: Secondary | ICD-10-CM

## 2023-09-02 DIAGNOSIS — I739 Peripheral vascular disease, unspecified: Secondary | ICD-10-CM | POA: Diagnosis not present

## 2023-09-02 NOTE — Progress Notes (Signed)
 Cardiology Office Note:    Date:  09/02/2023   ID:  Jason Thornton, DOB 11-07-1950, MRN [id number]  PCP:  Duwayne Katheryn HERO, PA  Cardiologist:  Lamar Fitch, MD    Referring MD: Duwayne Katheryn HERO, [place]   No chief complaint on file.     History of Present Illness:    Jason Thornton is a 73 y.o. 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 Non-obstructing carotids. Non-obstructing carotids.  Formatting of this note might be different from the original. Non-obstructing carotids. Formatting of this note might be different from the original. Overview:  Non-obstructing carotids.   Diabetes mellitus without complication (HCC)    Dyslipidemia (high LDL; low HDL) 03/13/2016   Dyspnea on exertion 12/06/2019   Elevated hemoglobin A1c 11/25/2017   Essential hypertension 04/20/2014   Fatty liver    Gastroesophageal reflux disease without esophagitis 04/20/2014   GERD (gastroesophageal reflux disease)    Hyperlipidemia    Hypertension    Murmur, cardiac 07/04/2017   Pain of toe of right foot 06/23/2019   Peripheral vascular disease (HCC) 04/20/2014   Formatting of this note might be different from the original. Non-obstructing carotids. Non-obstructing carotids.  Formatting of this note might be different from the original.  Non-obstructing carotids. Formatting of this note might be different from the original. Overview:  Non-obstructing carotids.   Pure hypercholesterolemia 04/20/2014   Type 2 diabetes mellitus with diabetic peripheral angiopathy without gangrene, without long-term current use of insulin (HCC) 02/01/2019   Last Assessment & Plan:  Formatting of this note might be different from the original.   Umbilical hernia 07/04/2017    Past Surgical History:  Procedure Laterality Date   COLONOSCOPY  02/25/2011   Mild sigmoid diverticulosis. Small internal hemorrhoids. Otherwise normal colonoscopy   ESOPHAGOGASTRODUODENOSCOPY  11/18/2012   Presbyesophagus. Hiatal hernia   HERNIA REPAIR  2017   LYMPHADENECTOMY  10/2006   UPPER GASTROINTESTINAL ENDOSCOPY      Current Medications: Current Meds  Medication Sig   aspirin EC 81 MG tablet Take 1 tablet by mouth daily.   Docusate Sodium (COLACE PO) Take 1 tablet by mouth daily as needed (constipation).   ezetimibe  (ZETIA ) 10 MG tablet Take 1 tablet (10 mg total) by mouth daily.   glimepiride (AMARYL) 2 MG tablet Take 2 mg by mouth as directed. Take 2 tablets in the morning and 1 in the evening   lisinopril -hydrochlorothiazide  (ZESTORETIC ) 20-12.5 MG tablet Take 1 tablet by mouth daily.   metoprolol  succinate (TOPROL -XL) 25 MG 24 hr tablet TAKE 1 TABLET BY MOUTH EACH DAY   nitroGLYCERIN  (NITROSTAT ) 0.4 MG SL tablet Place 1 tablet (0.4 mg total) under the tongue every 5 (five) minutes as needed for chest pain.   Omega-3 Fatty Acids (FISH OIL PO) Take 1 tablet by mouth daily.   omeprazole  (PRILOSEC) 20 MG capsule Take 1  capsule (20 mg total) by mouth daily.   simvastatin  (ZOCOR ) 80 MG tablet Take 1 tablet (80 mg total) by mouth daily at 6 PM.   Current Facility-Administered Medications for the 09/02/23 encounter (Office Visit) with Nazir Hacker J, MD  Medication   0.9 %  sodium chloride  infusion     Allergies:   Niacin   Social History   Socioeconomic  History   Marital status: Married    Spouse name: Not on file   Number of children: Not on file   Years of education: Not on file   Highest education level: Not on file  Occupational History   Not on file  Tobacco Use   Smoking status: Never   Smokeless tobacco: Never  Vaping Use   Vaping status: Never Used  Substance and Sexual Activity   Alcohol use: Not Currently   Drug use: Never   Sexual activity: Not on file  Other Topics Concern   Not on file  Social History Narrative   Not on file   Social Drivers of Health   Financial Resource Strain: Not on file  Food Insecurity: Not on file  Transportation Needs: Not on file  Physical Activity: Not on file  Stress: Not on file  Social Connections: Unknown (06/11/2021)   Received from [facility]   Social Network    Social Network: Not on file     Family History: The patient's family history includes Alzheimer's disease in his mother; Breast cancer in his mother; Heart attack in his father; Prostate cancer in his brother and father. There is no history of Colon cancer, Esophageal cancer, Rectal cancer, or Stomach cancer. ROS:   Please see the history of present illness.    All 14 point review of systems negative except as described per history of present illness  EKGs/Labs/Other Studies Reviewed:    EKG Interpretation Date/Time:  Tuesday September 02 2023 11:09:14 EDT Ventricular Rate:  78 PR Interval:  212 QRS Duration:  90 QT Interval:  368 QTC Calculation: 419 R Axis:   -14  Text Interpretation: Sinus rhythm with 1st degree A-V block When compared with ECG of 01-Aug-2021 05:10, PREVIOUS ECG IS PRESENT Confirmed by Bernie Charleston [phone]) on 09/02/2023 11:22:54 AM    Recent Labs: 12/25/2022: ALT 19  Recent Lipid Panel    Component Value Date/Time   CHOL 164 12/25/2022 0915   TRIG 238 (H) 12/25/2022 0915   HDL 34 (L) 12/25/2022 0915   CHOLHDL 4.8 12/25/2022 0915   LDLCALC 90 12/25/2022 0915    Physical Exam:     VS:  BP (!) 152/80   Pulse 78   Ht 5' 8 (1.727 m)   Wt 212 lb 9.6 oz (96.4 kg)   SpO2 95%   BMI 32.33 kg/m     Wt Readings from Last 3 Encounters:  09/02/23 212 lb 9.6 oz (96.4 kg)  11/29/22 216 lb (98 kg)  06/06/22 208 lb 9.6 oz (94.6 kg)     GEN:  Well nourished, well developed in no acute distress HEENT: Normal NECK: No JVD; No carotid bruits LYMPHATICS: No lymphadenopathy CARDIAC: RRR, no murmurs, no rubs, no gallops RESPIRATORY:  Clear to auscultation without rales, wheezing or rhonchi  ABDOMEN: Soft, non-tender, non-distended MUSCULOSKELETAL:  No edema; No deformity  SKIN: Warm and dry LOWER EXTREMITIES: no swelling NEUROLOGIC:  Alert and oriented x 3 PSYCHIATRIC:  Normal affect   ASSESSMENT:    1. Dyslipidemia (high LDL; low HDL)   2. Coronary artery disease  involving native coronary artery of native heart without angina pectoris   3. Peripheral vascular disease (HCC)   4. Essential hypertension   5. Type 2 diabetes mellitus with diabetic peripheral angiopathy without gangrene, without long-term current use of insulin (HCC)   6. Mixed hyperlipidemia    PLAN:    In order of problems listed above:  Coronary disease stable without any symptoms continue risk modifications. Dyslipidemia I did review K PN which show me LDL 90 HDL 34 this is from November of last year he did have cholesterol checked by his primary care physician recently we will try to call to get a copy of it in the meantime we will continue with Zetia  as well as Zocor  80. 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 Repeat carotic ultrasound. Type 2 diabetes last hemoglobin A1c is 8.1.  He changed dramatically his diet eliminating all sweet drinks also eat more chicken and anticipate this to improved significantly he is diabetes.  I will put a order to have fasting lipid profile done as well as hemoglobin A1c.   Medication Adjustments/Labs and Tests Ordered: Current medicines  are reviewed at length with the patient today.  Concerns regarding medicines are outlined above.  Orders Placed This Encounter  Procedures   EKG 12-Lead   Medication changes: No orders of the defined types were placed in this encounter.   Signed, Lamar DOROTHA Fitch, MD, [facility] 09/02/2023 11:34 AM    [facility]

## 2023-09-02 NOTE — Patient Instructions (Signed)

## 2023-10-09 ENCOUNTER — Other Ambulatory Visit: Payer: Self-pay | Admitting: Cardiology

## 2024-01-12 ENCOUNTER — Telehealth: Payer: Self-pay | Admitting: Cardiology

## 2024-01-12 NOTE — Telephone Encounter (Signed)
 Pt wants to come in and get blood work done. Requesting order. Please advise.

## 2024-01-17 LAB — HEPATIC FUNCTION PANEL
ALT: 26 IU/L (ref 0–44)
AST: 28 IU/L (ref 0–40)
Albumin: 4.7 g/dL (ref 3.8–4.8)
Alkaline Phosphatase: 60 IU/L (ref 47–123)
Bilirubin Total: 1.3 mg/dL — ABNORMAL HIGH (ref 0.0–1.2)
Bilirubin, Direct: 0.36 mg/dL (ref 0.00–0.40)
Total Protein: 6.7 g/dL (ref 6.0–8.5)

## 2024-01-17 LAB — LIPID PANEL
Chol/HDL Ratio: 3.6 ratio (ref 0.0–5.0)
Cholesterol, Total: 128 mg/dL (ref 100–199)
HDL: 36 mg/dL — ABNORMAL LOW
LDL Chol Calc (NIH): 66 mg/dL (ref 0–99)
Triglycerides: 146 mg/dL (ref 0–149)
VLDL Cholesterol Cal: 26 mg/dL (ref 5–40)

## 2024-01-17 LAB — HEMOGLOBIN A1C
Est. average glucose Bld gHb Est-mCnc: 174 mg/dL
Hgb A1c MFr Bld: 7.7 % — ABNORMAL HIGH (ref 4.8–5.6)

## 2024-01-19 ENCOUNTER — Ambulatory Visit: Payer: Self-pay | Admitting: Cardiology

## 2024-03-09 ENCOUNTER — Ambulatory Visit: Admitting: Cardiology
